# Patient Record
Sex: Male | Born: 1965 | Race: Black or African American | Hispanic: No | Marital: Married | Smoking: Light tobacco smoker
Health system: Southern US, Community
[De-identification: ages and names within clinical notes are randomized; demographics above are authoritative.]

## PROBLEM LIST (undated history)

## (undated) DIAGNOSIS — E291 Testicular hypofunction: Secondary | ICD-10-CM

## (undated) DIAGNOSIS — G43909 Migraine, unspecified, not intractable, without status migrainosus: Secondary | ICD-10-CM

## (undated) DIAGNOSIS — I10 Essential (primary) hypertension: Secondary | ICD-10-CM

## (undated) DIAGNOSIS — G4730 Sleep apnea, unspecified: Secondary | ICD-10-CM

## (undated) DIAGNOSIS — G4733 Obstructive sleep apnea (adult) (pediatric): Secondary | ICD-10-CM

## (undated) HISTORY — DX: Sleep apnea, unspecified: G47.30

## (undated) HISTORY — PX: KNEE SURGERY: SHX244

## (undated) HISTORY — DX: Migraine, unspecified, not intractable, without status migrainosus: G43.909

## (undated) HISTORY — DX: Testicular hypofunction: E29.1

## (undated) HISTORY — DX: Obstructive sleep apnea (adult) (pediatric): G47.33

## (undated) HISTORY — PX: NO PAST SURGERIES: SHX2092

---

## 1997-08-18 ENCOUNTER — Encounter: Admission: RE | Admit: 1997-08-18 | Discharge: 1997-08-18 | Payer: Self-pay | Admitting: Sports Medicine

## 1998-01-04 ENCOUNTER — Encounter: Payer: Self-pay | Admitting: Emergency Medicine

## 1998-01-04 ENCOUNTER — Emergency Department (HOSPITAL_COMMUNITY): Admission: EM | Admit: 1998-01-04 | Discharge: 1998-01-04 | Payer: Self-pay | Admitting: Emergency Medicine

## 1999-01-02 ENCOUNTER — Encounter: Payer: Self-pay | Admitting: Emergency Medicine

## 1999-01-02 ENCOUNTER — Emergency Department (HOSPITAL_COMMUNITY): Admission: EM | Admit: 1999-01-02 | Discharge: 1999-01-02 | Payer: Self-pay | Admitting: *Deleted

## 1999-01-05 ENCOUNTER — Encounter: Payer: Self-pay | Admitting: Emergency Medicine

## 1999-01-05 ENCOUNTER — Emergency Department (HOSPITAL_COMMUNITY): Admission: EM | Admit: 1999-01-05 | Discharge: 1999-01-05 | Payer: Self-pay | Admitting: Emergency Medicine

## 2000-05-12 ENCOUNTER — Emergency Department (HOSPITAL_COMMUNITY): Admission: EM | Admit: 2000-05-12 | Discharge: 2000-05-12 | Payer: Self-pay | Admitting: Emergency Medicine

## 2001-11-21 ENCOUNTER — Emergency Department (HOSPITAL_COMMUNITY): Admission: EM | Admit: 2001-11-21 | Discharge: 2001-11-21 | Payer: Self-pay

## 2001-11-22 ENCOUNTER — Emergency Department (HOSPITAL_COMMUNITY): Admission: EM | Admit: 2001-11-22 | Discharge: 2001-11-22 | Payer: Self-pay | Admitting: Emergency Medicine

## 2002-07-24 ENCOUNTER — Emergency Department (HOSPITAL_COMMUNITY): Admission: EM | Admit: 2002-07-24 | Discharge: 2002-07-24 | Payer: Self-pay | Admitting: Emergency Medicine

## 2002-07-24 ENCOUNTER — Encounter: Payer: Self-pay | Admitting: Emergency Medicine

## 2004-11-29 ENCOUNTER — Emergency Department (HOSPITAL_COMMUNITY): Admission: EM | Admit: 2004-11-29 | Discharge: 2004-11-29 | Payer: Self-pay | Admitting: Emergency Medicine

## 2005-06-02 ENCOUNTER — Emergency Department (HOSPITAL_COMMUNITY): Admission: EM | Admit: 2005-06-02 | Discharge: 2005-06-02 | Payer: Self-pay | Admitting: Family Medicine

## 2007-11-11 ENCOUNTER — Emergency Department (HOSPITAL_COMMUNITY): Admission: EM | Admit: 2007-11-11 | Discharge: 2007-11-12 | Payer: Self-pay | Admitting: Emergency Medicine

## 2008-04-17 ENCOUNTER — Encounter: Payer: Self-pay | Admitting: Internal Medicine

## 2008-04-17 ENCOUNTER — Ambulatory Visit (HOSPITAL_BASED_OUTPATIENT_CLINIC_OR_DEPARTMENT_OTHER): Admission: RE | Admit: 2008-04-17 | Discharge: 2008-04-17 | Payer: Self-pay | Admitting: Internal Medicine

## 2008-04-23 ENCOUNTER — Ambulatory Visit: Payer: Self-pay | Admitting: Internal Medicine

## 2008-05-12 ENCOUNTER — Ambulatory Visit: Payer: Self-pay | Admitting: Internal Medicine

## 2008-05-12 DIAGNOSIS — G4733 Obstructive sleep apnea (adult) (pediatric): Secondary | ICD-10-CM | POA: Insufficient documentation

## 2009-06-07 ENCOUNTER — Telehealth (INDEPENDENT_AMBULATORY_CARE_PROVIDER_SITE_OTHER): Payer: Self-pay | Admitting: *Deleted

## 2009-07-20 ENCOUNTER — Telehealth: Payer: Self-pay | Admitting: Internal Medicine

## 2010-02-28 NOTE — Progress Notes (Signed)
Summary: nos appt  Phone Note Call from Patient   Caller: [email or web address]  Call For: young Summary of Call: LMTCB x2 to rsc nos from 6/23. Initial call taken by: Darletta Moll,  July 20, 2009 2:29 PM

## 2010-02-28 NOTE — Progress Notes (Signed)
Summary: cpap/ appt  Phone Note Call from Patient   Caller: Patient Call For: YOUNG Summary of Call: pt has appt sched'd for 5/25- f/u sleep. pt wants to know if he needs to bring his cpap or the chip in with him. call cell [phone] and leave msg as pt is at work and won't be able to answer.  Initial call taken by: Tivis Ringer, CNA,  Jun 07, 2009 11:46 AM  Follow-up for Phone Call        Please advise. Abigail Miyamoto RN  Jun 07, 2009 11:55 AM   Additional Follow-up for Phone Call Additional follow up Details #1::        Please have him get the chip to his homecare company. He doesn't need to bring the [facility] unless he wants to show me something. Additional Follow-up by: Waymon Budge MD,  Jun 07, 2009 12:19 PM    Additional Follow-up for Phone Call Additional follow up Details #2::    LM for pt to take chip form CPAP to homecare company for download before ov with Dr Maple Hudson. Abigail Miyamoto RN  Jun 07, 2009 12:35 PM

## 2010-06-14 NOTE — Procedures (Signed)
NAME:  Jesus Bradford, Jesus Bradford NO.:  [id number]   MEDICAL RECORD NO.:  [id number]          PATIENT TYPE:  OUT   LOCATION:  SLEEP CENTER                 FACILITY:  [facility]   PHYSICIAN:  Clinton D. Maple Hudson, MD, FCCP, FACPDATE OF BIRTH:  1967/10/10   DATE OF STUDY:                            NOCTURNAL POLYSOMNOGRAM   REFERRING PHYSICIAN:  Eric L. August Saucer, M.D.   INDICATION FOR STUDY:  Hypersomnia with sleep apnea.   EPWORTH SLEEPINESS SCORE:  Epworth sleepiness score 6/24.  BMI 40.7.  Weight 300 pounds.  Height 72 inches.  Neck 17.5 inches.   HOME MEDICATIONS:  None listed.   SLEEP ARCHITECTURE:  Split study protocol.  During the diagnostic phase,  total sleep time was 153.5 minutes with sleep efficiency 80.8%.  Stage I  was 18.2%.  Stage II was 73.3%.  Stage III was absent.  REM 8.5% of  total sleep time.  Sleep latency 12.5 minutes.  REM latency 49.5  minutes.  Awake after sleep onset 25 minutes.  Arousal index markedly  increased at 102.4 indicating increased EEG arousals.   RESPIRATORY DATA:  Split study protocol.  Apnea-hypopnea index (AHI)  113.4 per hour.  A total of 290 events was scored including 278  obstructive apneas and 12 hypopneas.  Events were not positional.  REM  AHI 60 per hour.  CPAP was then titrated to 13 CWP, AHI 0 per hour.  He  wore a medium ResMed Quattro full-face mask with heated humidifier.  Technician commented on the patient's nasal congestion attributed by the  patient to a recent cold.   OXYGEN DATA:  Moderate snoring with oxygen desaturation to a nadir of  68% before CPAP.  After CPAP control, mean oxygen saturation held 94.5%  on room air.   CARDIAC DATA:  Sinus rhythm with occasional PAC.   MOVEMENT/PARASOMNIA:  No significant movement disturbance.  Bathroom x1.  Bruxism was noted.   IMPRESSION/RECOMMENDATIONS:  1. Severe obstructive sleep apnea/hypopnea syndrome, apnea-hypopnea      index 113.4 per hour with non-positional events,  moderate snoring,      and oxygen desaturation to a nadir of 68%.  2. Successful continuous positive airway pressure titration to 13      centimeters of water pressure, apnea-hypopnea index 0 per hour.  He      wore a medium ResMed Quattro full-face mask with heated humidifier.  3. Bruxism.  4. The patient attributed above- normal nasal congestion for himself      to a recent head cold.      Clinton D. Maple Hudson, MD, [facility], FACP  Diplomate, Biomedical engineer of Sleep Medicine  Electronically Signed     CDY/MEDQ  D:  04/23/2008 16:24:25  T:  04/24/2008 01:41:59  Job:  [id number]

## 2010-10-28 LAB — DIFFERENTIAL
Eosinophils Absolute: 0.4
Eosinophils Relative: 5
Monocytes Absolute: 0.8
Neutro Abs: 6.7
Neutrophils Relative %: 70

## 2010-10-28 LAB — POCT CARDIAC MARKERS: CKMB, poc: 1.6

## 2010-10-28 LAB — CBC
HCT: 42
Hemoglobin: 14
MCHC: 33.3
MCV: 93.3
Platelets: 302
RDW: 13

## 2011-02-14 ENCOUNTER — Telehealth: Payer: Self-pay | Admitting: Internal Medicine

## 2011-02-14 NOTE — Telephone Encounter (Signed)
I spoke with pt and is coming in 03/03/11 at 2:00 for OV since it has been since 2010 pt was last seen. Pt was fine with this and nothing further was needed

## 2011-02-28 ENCOUNTER — Encounter: Payer: Self-pay | Admitting: Internal Medicine

## 2011-03-03 ENCOUNTER — Ambulatory Visit: Payer: Self-pay | Admitting: Internal Medicine

## 2011-03-10 ENCOUNTER — Ambulatory Visit: Payer: Self-pay | Admitting: Internal Medicine

## 2011-06-13 ENCOUNTER — Ambulatory Visit (INDEPENDENT_AMBULATORY_CARE_PROVIDER_SITE_OTHER): Payer: BC Managed Care – PPO | Admitting: Internal Medicine

## 2011-06-13 ENCOUNTER — Encounter: Payer: Self-pay | Admitting: Internal Medicine

## 2011-06-13 VITALS — BP 140/92 | HR 71 | Ht 72.0 in | Wt 319.8 lb

## 2011-06-13 DIAGNOSIS — G4733 Obstructive sleep apnea (adult) (pediatric): Secondary | ICD-10-CM

## 2011-06-13 DIAGNOSIS — Z9989 Dependence on other enabling machines and devices: Secondary | ICD-10-CM

## 2011-06-13 NOTE — Progress Notes (Signed)
06/13/11- 24 yoM former smoker followed for obstructive sleep apnea  PCP Dr Willey Blade LOV 05/12/08 NPSG 04/23/08 AHI 113.4/ hr; CPAP 13/ AHI 0/hr, weight 300 lbs Pt states wears mask for 7 hours every night.pt having mask issues  , pressure seems to be ok.  He continues CPAP at 13/Advanced, worn all night every night. He denies snoring and feels rested. Bedtime 11 PM, sleep latency 30 minutes, waking once before up at 7 AM. He has gained 19 pounds since his original sleep study. Has not had any ENT surgery. There is no family history of sleep apnea.Marland Kitchen  ROS-see HPI Constitutional:   No-   weight loss, night sweats, fevers, chills, fatigue, lassitude. HEENT:   No-  headaches, difficulty swallowing, tooth/dental problems, sore throat,       No-  sneezing, itching, ear ache, nasal congestion, post nasal drip,  CV:  No-   chest pain, orthopnea, PND, swelling in lower extremities, anasarca, dizziness, palpitations Resp: No-   shortness of breath with exertion or at rest.              No-   productive cough,  No non-productive cough,  No- coughing up of blood.              No-   change in color of mucus.  No- wheezing.   Skin: No-   rash or lesions. GI:  No-   heartburn, indigestion, abdominal pain, nausea, vomiting,  GU: . MS:  No-   joint pain or swelling. . Neuro-     nothing unusual Psych:  No- change in mood or affect. No depression or anxiety.  No memory loss.  OBJ- Physical Exam General- Alert, Oriented, Affect-appropriate, Distress- none acute, obese Skin- rash-none, lesions- none, excoriation- none Lymphadenopathy- none Head- atraumatic            Eyes- Gross vision intact, PERRLA, conjunctivae and secretions clear            Ears- Hearing, canals-normal            Nose- Clear, no-Septal dev, mucus, polyps, erosion, perforation             Throat- Mallampati IV , mucosa clear , drainage- none, tonsils present Neck- thick neck , trachea midline, no stridor , thyroid nl, carotid no  bruit Chest - symmetrical excursion , unlabored           Heart/CV- RRR , no murmur , no gallop  , no rub, nl s1 s2                           - JVD- none , edema- none, stasis changes- none, varices- none           Lung- clear to P&A, wheeze- none, cough- none , dullness-none, rub- none           Chest wall-  Abd-  Br/ Gen/ Rectal- Not done, not indicated Extrem- cyanosis- none, clubbing, none, atrophy- none, strength- nl Neuro- grossly intact to observation

## 2011-06-13 NOTE — Patient Instructions (Signed)
Order- DME Advanced- replacement CPAP mask of choice and supplies     Dx OSA

## 2011-06-20 NOTE — Assessment & Plan Note (Signed)
Importance of weight loss for general health and specifically for his sleep apnea was discussed

## 2011-06-20 NOTE — Assessment & Plan Note (Signed)
He is describing good compliance and control with long-term use of CPAP 13/Advanced using a nasal mask. He needs help replacing an old mask. We reviewed the medical importance of sleep apnea and available treatment options. Weight loss and driving responsibility were emphasized.

## 2012-03-04 ENCOUNTER — Encounter: Payer: Self-pay | Admitting: Hematology

## 2012-06-02 ENCOUNTER — Telehealth: Payer: Self-pay | Admitting: Internal Medicine

## 2012-06-02 NOTE — Telephone Encounter (Signed)
Called pt x's 3 to make next ov per recall.  Pt never returned calls.  Mailed recall letter 06/02/12. Emily E McAlister °

## 2012-06-03 ENCOUNTER — Encounter: Payer: Self-pay | Admitting: Internal Medicine

## 2012-11-23 ENCOUNTER — Emergency Department (HOSPITAL_COMMUNITY): Admission: EM | Admit: 2012-11-23 | Discharge: 2012-11-23 | Discharge status: Against Medical Advice | Payer: Self-pay

## 2012-11-23 NOTE — ED Notes (Signed)
Pt reports he has dental pain to his entire mouth, seen here about a month ago for the same.  [facility] to [facility] and was told he needs insurance to have them removed.

## 2014-08-31 ENCOUNTER — Encounter (HOSPITAL_COMMUNITY): Payer: Self-pay

## 2014-08-31 ENCOUNTER — Emergency Department (HOSPITAL_COMMUNITY): Payer: 59

## 2014-08-31 ENCOUNTER — Emergency Department (HOSPITAL_COMMUNITY)
Admission: EM | Admit: 2014-08-31 | Discharge: 2014-08-31 | Disposition: A | Discharge status: Home / Self Care | Payer: 59 | Attending: Emergency Medicine | Admitting: Emergency Medicine

## 2014-08-31 DIAGNOSIS — R079 Chest pain, unspecified: Secondary | ICD-10-CM

## 2014-08-31 DIAGNOSIS — Z87828 Personal history of other (healed) physical injury and trauma: Secondary | ICD-10-CM | POA: Diagnosis not present

## 2014-08-31 DIAGNOSIS — Z8679 Personal history of other diseases of the circulatory system: Secondary | ICD-10-CM | POA: Diagnosis not present

## 2014-08-31 DIAGNOSIS — Z79899 Other long term (current) drug therapy: Secondary | ICD-10-CM | POA: Diagnosis not present

## 2014-08-31 DIAGNOSIS — R200 Anesthesia of skin: Secondary | ICD-10-CM | POA: Diagnosis not present

## 2014-08-31 DIAGNOSIS — R072 Precordial pain: Secondary | ICD-10-CM | POA: Diagnosis present

## 2014-08-31 DIAGNOSIS — Z8669 Personal history of other diseases of the nervous system and sense organs: Secondary | ICD-10-CM | POA: Diagnosis not present

## 2014-08-31 DIAGNOSIS — Z87891 Personal history of nicotine dependence: Secondary | ICD-10-CM | POA: Diagnosis not present

## 2014-08-31 LAB — I-STAT TROPONIN, ED: TROPONIN I, POC: 0 ng/mL (ref 0.00–0.08)

## 2014-08-31 LAB — CBC
HCT: 41.5 % (ref 39.0–52.0)
Hemoglobin: 13.9 g/dL (ref 13.0–17.0)
MCH: 31.7 pg (ref 26.0–34.0)
MCHC: 33.5 g/dL (ref 30.0–36.0)
MCV: 94.7 fL (ref 78.0–100.0)
PLATELETS: 295 10*3/uL (ref 150–400)
RBC: 4.38 MIL/uL (ref 4.22–5.81)
RDW: 12.3 % (ref 11.5–15.5)
WBC: 5.7 10*3/uL (ref 4.0–10.5)

## 2014-08-31 LAB — BASIC METABOLIC PANEL
Anion gap: 5 (ref 5–15)
BUN: 11 mg/dL (ref 6–20)
CHLORIDE: 109 mmol/L (ref 101–111)
CO2: 26 mmol/L (ref 22–32)
Calcium: 9 mg/dL (ref 8.9–10.3)
Creatinine, Ser: 0.79 mg/dL (ref 0.61–1.24)
GFR calc Af Amer: >60 mL/min (ref >60)
GLUCOSE: 92 mg/dL (ref 65–99)
Potassium: 4.1 mmol/L (ref 3.5–5.1)
SODIUM: 140 mmol/L (ref 135–145)

## 2014-08-31 MED ORDER — ASPIRIN 81 MG PO CHEW
324.0000 mg | CHEWABLE_TABLET | Freq: Once | ORAL | Status: AC
  Administered 2014-08-31: 324 mg via ORAL
  Filled 2014-08-31: qty 4

## 2014-08-31 NOTE — Discharge Instructions (Signed)

## 2014-08-31 NOTE — ED Provider Notes (Signed)
CSN: [id number]     Arrival date & time 08/31/14  1008 History   First MD Initiated Contact with Patient 08/31/14 1057     Chief Complaint  Patient presents with  . Chest Pain  . Numbness     (Consider location/radiation/quality/duration/timing/severity/associated sxs/prior Treatment) Patient is a 49 y.o. male presenting with chest pain. The history is provided by the patient.  Chest Pain Pain location:  Substernal area Pain quality: pressure   Pain radiates to:  Epigastrium Pain radiates to the back: no   Pain severity:  Moderate Onset quality:  Sudden Duration:  1 day Timing:  Constant Progression:  Resolved Chronicity:  Recurrent Context: stress   Relieved by:  Rest Worsened by:  Nothing tried Ineffective treatments:  None tried Associated symptoms: numbness (left arm)   Associated symptoms: no abdominal pain, no anxiety, no back pain, no fever, no headache, no nausea, no near-syncope, no orthopnea, no palpitations, no shortness of breath and not vomiting   Risk factors: male sex   Risk factors: no coronary artery disease, no diabetes mellitus, no high cholesterol, no hypertension and no smoking    49 yo M with a chief complaint of chest pain. This started yesterday lasted for about 24 hours has not occurred today. Patient states that the pain started retrosternally radiated up into his neck and was associated with some paresthesias to his left arm. Patient stated this started when he was driving with his family up to [place]. Patient states that they made multiple stops and he got out of moves around during this trip. Patient denies any leg swelling denies any history of blood clots denies cough fever or chills. Paresthesias to the entire left arm. Noted nerve distribution. Patient denies any nausea shortness of breath diaphoresis with chest pain. Patient has pain like this previously sometimes related to eating. He also states that when he goes to the gym and works out it  re-creates this pain. Usually improves with rest.  Past Medical History  Diagnosis Date  . OSA (obstructive sleep apnea)   . Hypogonadism male   . Migraines   . Concussion     football  . Sleep apnea    Past Surgical History  Procedure Laterality Date  . No past surgeries     Family History  Problem Relation Age of Onset  . Diabetes Father   . Allergy (severe) Father   . Migraines Mother   . Asthma Mother    History  Substance Use Topics  . Smoking status: Former Smoker -- 1.00 packs/day for 10 years    Types: Cigarettes    Quit date: 01/28/2003  . Smokeless tobacco: Never Used  . Alcohol Use: Yes     Comment: SOCIALLY     Review of Systems  Constitutional: Negative for fever and chills.  HENT: Negative for congestion and facial swelling.   Eyes: Negative for discharge and visual disturbance.  Respiratory: Positive for chest tightness. Negative for shortness of breath.   Cardiovascular: Positive for chest pain. Negative for palpitations, orthopnea and near-syncope.  Gastrointestinal: Negative for nausea, vomiting, abdominal pain and diarrhea.  Musculoskeletal: Negative for myalgias, back pain and arthralgias.  Skin: Negative for color change and rash.  Neurological: Positive for numbness (left arm). Negative for tremors, syncope and headaches.  Psychiatric/Behavioral: Negative for confusion and dysphoric mood.      Allergies  Review of patient's allergies indicates no known allergies.  Home Medications   Prior to Admission medications   Medication Sig  Start Date End Date Taking? Authorizing Provider  eletriptan (RELPAX) 20 MG tablet Take 20 mg by mouth as needed for migraine or headache. May repeat in 2 hours if headache persists or recurs.   Yes Historical Provider, MD  ibuprofen (ADVIL,MOTRIN) 800 MG tablet Take 800 mg by mouth every 8 (eight) hours as needed for headache or moderate pain.   Yes Historical Provider, MD  Multiple Vitamin (ONE-A-DAY MENS PO)  Take 1 tablet by mouth daily.   Yes Historical Provider, MD  Multiple Vitamins-Minerals (ZINC PO) Take 1 tablet by mouth daily.   Yes Historical Provider, MD   BP 162/62 mmHg  Pulse 61  Temp(Src) 98.1 F (36.7 C) (Oral)  Resp 18  SpO2 97% Physical Exam  Constitutional: He is oriented to person, place, and time. He appears well-developed and well-nourished.  HENT:  Head: Normocephalic and atraumatic.  Eyes: EOM are normal. Pupils are equal, round, and reactive to light.  Neck: Normal range of motion. Neck supple. No JVD present.  Cardiovascular: Normal rate and regular rhythm.  Exam reveals no gallop and no friction rub.   No murmur heard. Pulmonary/Chest: No respiratory distress. He has no wheezes. He exhibits no tenderness.  Abdominal: He exhibits no distension. There is no tenderness. There is no rebound and no guarding.  Musculoskeletal: Normal range of motion. He exhibits no edema or tenderness.  Full range of motion of neck without recreation of symptoms. No noted trapezius tenderness. No pain with range of motion of the shoulder. No increased pain with Tinel's sign. Pulse motor and sensation intact distally.  Neurological: He is alert and oriented to person, place, and time.  Skin: No rash noted. No pallor.  Psychiatric: He has a normal mood and affect. His behavior is normal.    ED Course  Procedures (including critical care time) Labs Review Labs Reviewed  BASIC METABOLIC PANEL  CBC  I-STAT TROPOININ, ED    Imaging Review No results found.   EKG Interpretation   Date/Time:  Thursday August 31 2014 10:18:20 EDT Ventricular Rate:  54 PR Interval:  151 QRS Duration: 91 QT Interval:  424 QTC Calculation: 402 R Axis:   15 Text Interpretation:  Sinus rhythm Borderline T abnormalities, inferior  leads Baseline wander in lead(s) V2 No significant change was found  Confirmed by Shernell Saldierna MD, DANIEL ([id number]) on 08/31/2014 10:20:38 AM      MDM   Final diagnoses:  Chest  pain, unspecified chest pain type    49 yo M with a chief complaint of chest pain. PERC negative, very low risk for cardiac disease. Patient has some typical symptoms. Will obtain one troponin with no chest pain over the past 12 hours. EKG unchanged from prior.  Troponin negative. Lab work otherwise unremarkable. Chest x-ray reviewed by me negative.  3:39 PM:  I have discussed the diagnosis/risks/treatment options with the patient and family and believe the pt to be eligible for discharge home to follow-up with PCP for possible stress. We also discussed returning to the ED immediately if new or worsening sx occur. We discussed the sx which are most concerning (e.g., sudden worsening chest pain) that necessitate immediate return. Medications administered to the patient during their visit and any new prescriptions provided to the patient are listed below.  Medications given during this visit Medications  aspirin chewable tablet 324 mg (324 mg Oral Given 08/31/14 1141)    Discharge Medication List as of 08/31/2014  1:47 PM       The patient  appears reasonably screen and/or stabilized for discharge and I doubt any other medical condition or other [facility] requiring further screening, evaluation, or treatment in the ED at this time prior to discharge.    Melene Plan, DO 08/31/14 1539

## 2014-08-31 NOTE — ED Notes (Signed)
Pt c/o central and L sided chest pain radiating into bilateral shoulders, neck, and arms x 2 episodes x 1 week.  Denies pain.  Pt reports intermittent chest pain w/ working out x "forever."  Sts when the pain radiates down his arms, it turns into numbness.  Pt reports he has "just been sitting" when these episodes have occured.

## 2014-09-13 ENCOUNTER — Ambulatory Visit
Admission: RE | Admit: 2014-09-13 | Discharge: 2014-09-13 | Disposition: A | Discharge status: Home / Self Care | Payer: 59 | Source: Ambulatory Visit | Attending: Internal Medicine | Admitting: Internal Medicine

## 2014-09-13 ENCOUNTER — Other Ambulatory Visit: Payer: Self-pay | Admitting: Internal Medicine

## 2014-09-13 DIAGNOSIS — M542 Cervicalgia: Secondary | ICD-10-CM

## 2014-09-26 ENCOUNTER — Other Ambulatory Visit: Payer: Self-pay | Admitting: Internal Medicine

## 2014-09-26 DIAGNOSIS — R1310 Dysphagia, unspecified: Secondary | ICD-10-CM

## 2014-10-06 ENCOUNTER — Ambulatory Visit
Admission: RE | Admit: 2014-10-06 | Discharge: 2014-10-06 | Disposition: A | Discharge status: Home / Self Care | Payer: 59 | Source: Ambulatory Visit | Attending: Internal Medicine | Admitting: Internal Medicine

## 2014-10-06 DIAGNOSIS — R1310 Dysphagia, unspecified: Secondary | ICD-10-CM

## 2018-11-25 ENCOUNTER — Emergency Department (HOSPITAL_COMMUNITY): Payer: 59

## 2018-11-25 ENCOUNTER — Other Ambulatory Visit: Payer: Self-pay

## 2018-11-25 ENCOUNTER — Encounter (HOSPITAL_COMMUNITY): Payer: Self-pay | Admitting: Emergency Medicine

## 2018-11-25 ENCOUNTER — Emergency Department (HOSPITAL_COMMUNITY)
Admission: EM | Admit: 2018-11-25 | Discharge: 2018-11-25 | Disposition: A | Discharge status: Home / Self Care | Payer: 59 | Attending: Emergency Medicine | Admitting: Emergency Medicine

## 2018-11-25 DIAGNOSIS — U071 COVID-19: Secondary | ICD-10-CM | POA: Insufficient documentation

## 2018-11-25 DIAGNOSIS — Z79899 Other long term (current) drug therapy: Secondary | ICD-10-CM | POA: Diagnosis not present

## 2018-11-25 DIAGNOSIS — Z87891 Personal history of nicotine dependence: Secondary | ICD-10-CM | POA: Insufficient documentation

## 2018-11-25 DIAGNOSIS — Z20822 Contact with and (suspected) exposure to covid-19: Secondary | ICD-10-CM

## 2018-11-25 DIAGNOSIS — R0602 Shortness of breath: Secondary | ICD-10-CM | POA: Diagnosis present

## 2018-11-25 LAB — COMPREHENSIVE METABOLIC PANEL
ALT: 28 U/L (ref 0–44)
AST: 34 U/L (ref 15–41)
Albumin: 3.6 g/dL (ref 3.5–5.0)
Alkaline Phosphatase: 76 U/L (ref 38–126)
Anion gap: 8 (ref 5–15)
BUN: 13 mg/dL (ref 6–20)
CO2: 24 mmol/L (ref 22–32)
Calcium: 8.8 mg/dL — ABNORMAL LOW (ref 8.9–10.3)
Chloride: 106 mmol/L (ref 98–111)
Creatinine, Ser: 0.81 mg/dL (ref 0.61–1.24)
GFR calc Af Amer: >60 mL/min (ref >60)
GFR calc non Af Amer: >60 mL/min (ref >60)
Glucose, Bld: 122 mg/dL — ABNORMAL HIGH (ref 70–99)
Potassium: 4.1 mmol/L (ref 3.5–5.1)
Sodium: 138 mmol/L (ref 135–145)
Total Bilirubin: 0.6 mg/dL (ref 0.3–1.2)
Total Protein: 7.4 g/dL (ref 6.5–8.1)

## 2018-11-25 LAB — CBC WITH DIFFERENTIAL/PLATELET
Abs Immature Granulocytes: 0.04 10*3/uL (ref 0.00–0.07)
Basophils Absolute: 0 10*3/uL (ref 0.0–0.1)
Basophils Relative: 1 %
Eosinophils Absolute: 0.3 10*3/uL (ref 0.0–0.5)
Eosinophils Relative: 5 %
HCT: 44.6 % (ref 39.0–52.0)
Hemoglobin: 14.2 g/dL (ref 13.0–17.0)
Immature Granulocytes: 1 %
Lymphocytes Relative: 20 %
Lymphs Abs: 1.2 10*3/uL (ref 0.7–4.0)
MCH: 31.1 pg (ref 26.0–34.0)
MCHC: 31.8 g/dL (ref 30.0–36.0)
MCV: 97.6 fL (ref 80.0–100.0)
Monocytes Absolute: 0.7 10*3/uL (ref 0.1–1.0)
Monocytes Relative: 11 %
Neutro Abs: 3.8 10*3/uL (ref 1.7–7.7)
Neutrophils Relative %: 62 %
Platelets: 232 10*3/uL (ref 150–400)
RBC: 4.57 MIL/uL (ref 4.22–5.81)
RDW: 12 % (ref 11.5–15.5)
WBC: 6.1 10*3/uL (ref 4.0–10.5)
nRBC: 0 % (ref 0.0–0.2)

## 2018-11-25 LAB — CBG MONITORING, ED: Glucose-Capillary: 123 mg/dL — ABNORMAL HIGH (ref 70–99)

## 2018-11-25 NOTE — ED Notes (Signed)
Patient verbalizes understanding of discharge instructions. Opportunity for questioning and answers were provided. Armband removed by staff, pt discharged from ED.  

## 2018-11-25 NOTE — ED Notes (Signed)
Pt reports feeling clammy "like his sugar is bottoming out," reports not diabetic. CBG checked 123. States "feels like I'm gonna pass out." VSS.

## 2018-11-25 NOTE — ED Notes (Signed)
Pt ambulated with pulse oximetry, no signs of shortness of breath, or hypoxia (sat stayed at 95-100% the whole time).

## 2018-11-25 NOTE — ED Triage Notes (Signed)
Pt to ER for evaluation of shortness of breath associated with COVID 19, reports tested positive on Monday at [facility]. Reports "I don't feel bad," but reports feeling congested. +Cough, and pain with coughing. He is no apparent distress. VSS, no fever. Hx of hypertension on medications.

## 2018-11-25 NOTE — ED Provider Notes (Signed)
[facility]ERGENCY DEPARTMENT Provider Note   CSN: [id number] Arrival date & time: 11/25/18  0706     History   Chief Complaint Chief Complaint  Patient presents with  . Shortness of Breath    COVID+    HPI Keni Maroney is a 53 y.o. male.      Shortness of Breath Severity:  Moderate Onset quality:  Sudden Duration:  5 minutes Timing:  Constant Progression:  Resolved Chronicity:  New Context comment:  Diagnosed with COVID 19 yesterday. Was raking leaves today when DOE occured. Relieved by:  Rest Worsened by:  Activity Ineffective treatments:  None tried Associated symptoms: no abdominal pain, no chest pain, no cough, no ear pain, no fever, no rash, no sore throat and no vomiting   Risk factors: obesity   Risk factors: no family hx of DVT, no hx of PE/DVT, no prolonged immobilization and no recent surgery     Past Medical History:  Diagnosis Date  . Concussion    football  . Hypogonadism male   . Migraines   . OSA (obstructive sleep apnea)   . Sleep apnea     Patient Active Problem List   Diagnosis Date Noted  . Morbid obesity ([place]) 06/20/2011  . OBSTRUCTIVE SLEEP APNEA 05/12/2008    Past Surgical History:  Procedure Laterality Date  . NO PAST SURGERIES          Home Medications    Prior to Admission medications   Medication Sig Start Date End Date Taking? Authorizing Provider  eletriptan (RELPAX) 20 MG tablet Take 20 mg by mouth as needed for migraine or headache. May repeat in 2 hours if headache persists or recurs.    [provider]  ibuprofen (ADVIL,MOTRIN) 800 MG tablet Take 800 mg by mouth every 8 (eight) hours as needed for headache or moderate pain.    [provider]  Multiple Vitamin (ONE-A-DAY MENS PO) Take 1 tablet by mouth daily.    [provider]  Multiple Vitamins-Minerals (ZINC PO) Take 1 tablet by mouth daily.    [provider]    Family History Family History  Problem  Relation Age of Onset  . Diabetes Father   . Allergy (severe) Father   . Migraines Mother   . Asthma Mother     Social History Social History   Tobacco Use  . Smoking status: Former Smoker    Packs/day: 1.00    Years: 10.00    Pack years: 10.00    Types: Cigarettes    Quit date: 01/28/2003    Years since quitting: 15.8  . Smokeless tobacco: Never Used  Substance Use Topics  . Alcohol use: Yes    Comment: SOCIALLY   . Drug use: No     Allergies   Patient has no known allergies.   Review of Systems Review of Systems  Constitutional: Negative for chills and fever.  HENT: Negative for ear pain and sore throat.   Eyes: Negative for pain and visual disturbance.  Respiratory: Negative for cough and shortness of breath.   Cardiovascular: Negative for chest pain and palpitations.  Gastrointestinal: Negative for abdominal pain and vomiting.  Genitourinary: Negative for dysuria and hematuria.  Musculoskeletal: Negative for arthralgias and back pain.  Skin: Negative for color change and rash.  Neurological: Negative for seizures and syncope.  All other systems reviewed and are negative.    Physical Exam Updated Vital Signs BP 134/90 (BP Location: Right Arm)   Pulse 78  Temp 98.2 F (36.8 C) (Oral)   Resp 18   SpO2 98%   Physical Exam Vitals signs and nursing note reviewed.  Constitutional:      Appearance: He is well-developed. He is not ill-appearing or diaphoretic.  HENT:     Head: Normocephalic and atraumatic.  Eyes:     Conjunctiva/sclera: Conjunctivae normal.  Neck:     Musculoskeletal: Neck supple.  Cardiovascular:     Rate and Rhythm: Normal rate and regular rhythm.     Heart sounds: No murmur.  Pulmonary:     Effort: Pulmonary effort is normal. No tachypnea, accessory muscle usage or respiratory distress.     Breath sounds: Normal breath sounds. No stridor. No decreased breath sounds or wheezing.  Abdominal:     Palpations: Abdomen is soft.      Tenderness: There is no abdominal tenderness.  Musculoskeletal: Normal range of motion.     Right lower leg: No edema.     Left lower leg: No edema.  Skin:    General: Skin is warm and dry.     Capillary Refill: Capillary refill takes less than 2 seconds.  Neurological:     General: No focal deficit present.     Mental Status: He is alert and oriented to person, place, and time.  Psychiatric:        Mood and Affect: Mood normal.      ED Treatments / Results  Labs (all labs ordered are listed, but only abnormal results are displayed) Labs Reviewed  COMPREHENSIVE METABOLIC PANEL - Abnormal; Notable for the following components:      Result Value   Glucose, Bld 122 (*)    Calcium 8.8 (*)    All other components within normal limits  CBG MONITORING, ED - Abnormal; Notable for the following components:   Glucose-Capillary 123 (*)    All other components within normal limits  CBC WITH DIFFERENTIAL/PLATELET    EKG None  Radiology No results found.  Procedures Procedures (including critical care time)  Medications Ordered in ED Medications - No data to display   Initial Impression / Assessment and Plan / ED Course  I have reviewed the triage vital signs and the nursing notes.  Pertinent labs & imaging results that were available during my care of the patient were reviewed by me and considered in my medical decision making (see chart for details).        Patient is a 53 year old male with history and physical exam as above presents emergency department for evaluation of shortness of breath this morning while raking leaves.  Symptoms have entirely resolved at this time.  He states he tested positive for COVID-19 yesterday but has not had any symptoms until today.  Denies cough or fever but states that he has had recent contact test positive for COVID-19.  Patient is resting in bed in no acute distress at this time.  Chest x-ray as well as CBC and metabolic panel were  obtained.  Chest x-ray demonstrated some findings consistent with scarring however no findings concerning for bacterial pneumonia at this time.  Doubt PE based on history and physical exam.  Doubt ACS based on history and physical exam and EKG demonstrates no emergent findings concerning for ischemia or dysrhythmia.  Patient was given my usual and customary discussion regarding COVID-19 as well as shortness of breath including anticipatory guidance and strict return precautions.  He verbalized understanding of plan.  Patient was ambulated in the emergency department with pulse ox  prior to discharge and did not desaturate.  Patient was seen in conjunction with my attending physician Dr. Jeraldine LootsLockwood.  Final Clinical Impressions(s) / ED Diagnoses   Final diagnoses:  COVID-19    ED Discharge Orders    None       Jonna Clarkyder, Rinda Rollyson, MD 11/25/18 1749    Gerhard MunchLockwood, Robert, MD 11/26/18 [phone]

## 2018-11-27 ENCOUNTER — Other Ambulatory Visit: Payer: Self-pay

## 2018-11-27 ENCOUNTER — Emergency Department (HOSPITAL_COMMUNITY): Payer: 59

## 2018-11-27 ENCOUNTER — Encounter (HOSPITAL_COMMUNITY): Payer: Self-pay

## 2018-11-27 ENCOUNTER — Emergency Department (HOSPITAL_COMMUNITY)
Admission: EM | Admit: 2018-11-27 | Discharge: 2018-11-27 | Disposition: A | Discharge status: Home / Self Care | Payer: 59 | Attending: Emergency Medicine | Admitting: Emergency Medicine

## 2018-11-27 DIAGNOSIS — R0600 Dyspnea, unspecified: Secondary | ICD-10-CM | POA: Diagnosis present

## 2018-11-27 DIAGNOSIS — U071 COVID-19: Secondary | ICD-10-CM | POA: Insufficient documentation

## 2018-11-27 DIAGNOSIS — Z87891 Personal history of nicotine dependence: Secondary | ICD-10-CM | POA: Diagnosis not present

## 2018-11-27 DIAGNOSIS — Z79899 Other long term (current) drug therapy: Secondary | ICD-10-CM | POA: Diagnosis not present

## 2018-11-27 LAB — CBC
HCT: 43.3 % (ref 39.0–52.0)
Hemoglobin: 14 g/dL (ref 13.0–17.0)
MCH: 31.5 pg (ref 26.0–34.0)
MCHC: 32.3 g/dL (ref 30.0–36.0)
MCV: 97.5 fL (ref 80.0–100.0)
Platelets: 232 10*3/uL (ref 150–400)
RBC: 4.44 MIL/uL (ref 4.22–5.81)
RDW: 11.9 % (ref 11.5–15.5)
WBC: 5.6 10*3/uL (ref 4.0–10.5)
nRBC: 0 % (ref 0.0–0.2)

## 2018-11-27 LAB — BASIC METABOLIC PANEL
Anion gap: 7 (ref 5–15)
BUN: 8 mg/dL (ref 6–20)
CO2: 24 mmol/L (ref 22–32)
Calcium: 9 mg/dL (ref 8.9–10.3)
Chloride: 107 mmol/L (ref 98–111)
Creatinine, Ser: 0.73 mg/dL (ref 0.61–1.24)
GFR calc Af Amer: >60 mL/min (ref >60)
GFR calc non Af Amer: >60 mL/min (ref >60)
Glucose, Bld: 99 mg/dL (ref 70–99)
Potassium: 3.9 mmol/L (ref 3.5–5.1)
Sodium: 138 mmol/L (ref 135–145)

## 2018-11-27 MED ORDER — BENZONATATE 100 MG PO CAPS: 100.0000 mg | ORAL_CAPSULE | Freq: Three times a day (TID) | ORAL | 0 refills | Status: DC

## 2018-11-27 MED ORDER — MENTHOL 3 MG MT LOZG
1.0000 | LOZENGE | OROMUCOSAL | Status: DC | PRN
  Filled 2018-11-27: qty 9

## 2018-11-27 NOTE — Discharge Instructions (Addendum)
Take the Tessalon medication to help with cough, try taking over-the-counter Cepacol to help with your sore throat.  Continue to monitor for worsening symptoms, shortness of breath

## 2018-11-27 NOTE — ED Provider Notes (Signed)
[facility]-EMERGENCY DEPT Provider Note   CSN: [id number] Arrival date & time: 11/27/18  1212     History   Chief Complaint Chief Complaint  Patient presents with  . Dysphagia    HPI Jesus Bradford is a 53 y.o. male.     HPI Patient was recently diagnosed with COVID-19.  He received results 2 days ago.  Patient started having symptoms yesterday.  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  Patient went to the emergency room at [facility].  Patient had chest x-ray and laboratory test.  He was discharged home.  Patient states since yesterday he continues to feel like it is hard for him to breathe.  He also has pain with swallowing and feels like his throat may be closing up.  He denies any leg swelling.  No difficulty speaking.  Patient came back to the ED today because of his worsening symptoms. Past Medical History:  Diagnosis Date  . Concussion    football  . Hypogonadism male   . Migraines   . OSA (obstructive sleep apnea)   . Sleep apnea     Patient Active Problem List   Diagnosis Date Noted  . Morbid obesity (HCC) 06/20/2011  . OBSTRUCTIVE SLEEP APNEA 05/12/2008    Past Surgical History:  Procedure Laterality Date  . NO PAST SURGERIES          Home Medications    Prior to Admission medications   Medication Sig Start Date End Date Taking? Authorizing Provider  diltiazem (DILACOR XR) 180 MG 24 hr capsule Take 180 mg by mouth daily.   Yes [provider]  eletriptan (RELPAX) 20 MG tablet Take 20 mg by mouth as needed for migraine or headache. May repeat in 2 hours if headache persists or recurs.   Yes [provider]  rosuvastatin (CRESTOR) 20 MG tablet Take 20 mg by mouth once a week. 11/26/18  Yes [provider]  benzonatate (TESSALON) 100 MG capsule Take 1 capsule (100 mg total) by mouth every 8 (eight) hours. 11/27/18   Jesus Dibbles, MD    Family History Family History  Problem  Relation Age of Onset  . Diabetes Father   . Allergy (severe) Father   . Migraines Mother   . Asthma Mother     Social History Social History   Tobacco Use  . Smoking status: Former Smoker    Packs/day: 1.00    Years: 10.00    Pack years: 10.00    Types: Cigarettes    Quit date: 01/28/2003    Years since quitting: 15.8  . Smokeless tobacco: Never Used  Substance Use Topics  . Alcohol use: Yes    Comment: SOCIALLY   . Drug use: No     Allergies   Patient has no known allergies.   Review of Systems Review of Systems  All other systems reviewed and are negative.    Physical Exam Updated Vital Signs BP 140/82   Pulse 63   Temp 99 F (37.2 C) (Oral)   SpO2 98%   Physical Exam Vitals signs and nursing note reviewed.  Constitutional:      General: He is not in acute distress.    Appearance: He is well-developed.  HENT:     Head: Normocephalic and atraumatic.     Right Ear: External ear normal.     Left Ear: External ear normal.     Nose: No rhinorrhea.  Mouth/Throat:     Mouth: Mucous membranes are moist.     Pharynx: Oropharynx is clear. No oropharyngeal exudate or posterior oropharyngeal erythema.     Comments: No edema of the uvula or posterior pharynx Eyes:     General: No scleral icterus.       Right eye: No discharge.        Left eye: No discharge.     Conjunctiva/sclera: Conjunctivae normal.  Neck:     Musculoskeletal: Neck supple.     Trachea: No tracheal deviation.  Cardiovascular:     Rate and Rhythm: Normal rate and regular rhythm.  Pulmonary:     Effort: Pulmonary effort is normal. No respiratory distress.     Breath sounds: Normal breath sounds. No stridor. No wheezing or rales.  Abdominal:     General: Bowel sounds are normal. There is no distension.     Palpations: Abdomen is soft.     Tenderness: There is no abdominal tenderness. There is no guarding or rebound.  Musculoskeletal:        General: No tenderness.  Skin:    General:  Skin is warm and dry.     Findings: No rash.  Neurological:     Mental Status: He is alert.     Cranial Nerves: No cranial nerve deficit (no facial droop, extraocular movements intact, no slurred speech).     Sensory: No sensory deficit.     Motor: No abnormal muscle tone or seizure activity.     Coordination: Coordination normal.      ED Treatments / Results  Labs (all labs ordered are listed, but only abnormal results are displayed) Labs Reviewed  CBC  BASIC METABOLIC PANEL    EKG None  Radiology Dg Chest Portable 1 View  Result Date: 11/27/2018 CLINICAL DATA:  Worsening dyspnea.  COVID-19 virus infection. EXAM: PORTABLE CHEST 1 VIEW COMPARISON:  11/25/2018 FINDINGS: Lordotic positioning noted. Stable heart size. Both lungs are clear. No evidence of pneumothorax or pleural effusion. IMPRESSION: No active disease. Electronically Signed   By: Danae OrleansJohn A Stahl M.D.   On: 11/27/2018 13:53    Procedures Procedures (including critical care time)  Medications Ordered in ED Medications  menthol-cetylpyridinium (CEPACOL) lozenge 3 mg (has no administration in time range)     Initial Impression / Assessment and Plan / ED Course  I have reviewed the triage vital signs and the nursing notes.  Pertinent labs & imaging results that were available during my care of the patient were reviewed by me and considered in my medical decision making (see chart for details).      Patient presents with worsening symptoms associated with known COVID-19 infection.  Patient is complaining of sore throat, as well as difficulty breathing.  Patient was breathing easily in the ED.  No evidence of pneumonia on chest x-ray.  No evidence of pharyngeal edema or difficulty swallowing.  Symptoms are consistent with COVID-19 illness but fortunately no signs of severe complications at this time.  Patient still appears stable for outpatient management.  [name] was evaluated in Emergency Department on  11/27/2018 for the symptoms described in the history of present illness. He was evaluated in the context of the global COVID-19 pandemic, which necessitated consideration that the patient might be at risk for infection with the SARS-CoV-2 virus that causes COVID-19. Institutional protocols and algorithms that pertain to the evaluation of patients at risk for COVID-19 are in a state of rapid change based on information released by regulatory bodies  including the CDC and federal and state organizations. These policies and algorithms were followed during the patient's care in the ED.   Final Clinical Impressions(s) / ED Diagnoses   Final diagnoses:  COVID-19 virus infection    ED Discharge Orders         Ordered    benzonatate (TESSALON) 100 MG capsule  Every 8 hours     11/27/18 1433           Dorie Rank, MD 11/27/18 1609

## 2018-11-27 NOTE — ED Triage Notes (Signed)
Tells me he has recently tested positive for SARS CO-V and his c/o is of "having trouble swallowing; although it's better now". He is in no distress and is breathing normally.

## 2019-05-16 ENCOUNTER — Other Ambulatory Visit: Payer: Self-pay

## 2019-05-16 ENCOUNTER — Emergency Department (HOSPITAL_BASED_OUTPATIENT_CLINIC_OR_DEPARTMENT_OTHER): Payer: No Typology Code available for payment source

## 2019-05-16 ENCOUNTER — Emergency Department (HOSPITAL_BASED_OUTPATIENT_CLINIC_OR_DEPARTMENT_OTHER)
Admission: EM | Admit: 2019-05-16 | Discharge: 2019-05-16 | Disposition: A | Discharge status: Home / Self Care | Payer: No Typology Code available for payment source | Attending: Emergency Medicine | Admitting: Emergency Medicine

## 2019-05-16 DIAGNOSIS — Z87891 Personal history of nicotine dependence: Secondary | ICD-10-CM | POA: Diagnosis not present

## 2019-05-16 DIAGNOSIS — M25562 Pain in left knee: Secondary | ICD-10-CM | POA: Diagnosis present

## 2019-05-16 DIAGNOSIS — Z79899 Other long term (current) drug therapy: Secondary | ICD-10-CM | POA: Diagnosis not present

## 2019-05-16 MED ORDER — HYDROCODONE-ACETAMINOPHEN 5-325 MG PO TABS: 1.0000 | ORAL_TABLET | Freq: Four times a day (QID) | ORAL | 0 refills | Status: DC | PRN

## 2019-05-16 MED ORDER — IBUPROFEN 800 MG PO TABS: 800.0000 mg | ORAL_TABLET | Freq: Three times a day (TID) | ORAL | 0 refills | Status: DC | PRN

## 2019-05-16 MED FILL — IBUPROFEN 800 MG TAB: 800 | 7 days supply | Qty: 21 | Fill #0

## 2019-05-16 MED FILL — HYDROCODON-APAP 5-325: 5-325 | 2 days supply | Qty: 10 | Fill #0

## 2019-05-16 NOTE — ED Notes (Signed)
Pt ambulated to xr on crutches with steady gait

## 2019-05-16 NOTE — ED Notes (Signed)
Ice applied

## 2019-05-16 NOTE — ED Triage Notes (Signed)
Pt states injured his knee while at work, was loading equipment, and twisted, feeling snap in left knee.  Pt arrives on crutches.  Supervisor states need drug screen for visit.

## 2019-05-16 NOTE — ED Notes (Signed)
UDS completed at 0945am not at 1045am. Time was corrected on Pts form and refaxed.

## 2019-05-16 NOTE — ED Provider Notes (Signed)
[facility] EMERGENCY DEPARTMENT Provider Note   CSN: [id number] Arrival date & time: 05/16/19  2376     History Chief Complaint  Patient presents with  . Knee Pain    Jesus Bradford is a 54 y.o. male.  He is complaining of left knee injury that is work-related.  He said he was moving equipment inside a truck and pivoted felt a pop in his left knee with immediate pain.  Rates the pain is 8 out of 10 worse with movement.  Not associate with any numbness or weakness distal.  Prior history of meniscal tear with surgery along with ACL chronic tear.  Denies any other injuries or complaints.  Does not want any medication here.  The history is provided by the patient.  Knee Pain Location:  Knee Injury: yes   Mechanism of injury comment:  Twisted Knee location:  L knee Pain details:    Quality:  Aching and sharp   Radiates to:  Does not radiate   Severity:  Severe   Onset quality:  Sudden   Timing:  Constant   Progression:  Unchanged Chronicity:  New Dislocation: no   Foreign body present:  No foreign bodies Relieved by:  Nothing Worsened by:  Bearing weight, extension, flexion and activity Ineffective treatments:  Elevation Associated symptoms: decreased ROM   Associated symptoms: no back pain, no fever, no muscle weakness, no numbness and no tingling        Past Medical History:  Diagnosis Date  . Concussion    football  . Hypogonadism male   . Migraines   . OSA (obstructive sleep apnea)   . Sleep apnea     Patient Active Problem List   Diagnosis Date Noted  . Morbid obesity ([place]) 06/20/2011  . OBSTRUCTIVE SLEEP APNEA 05/12/2008    Past Surgical History:  Procedure Laterality Date  . NO PAST SURGERIES         Family History  Problem Relation Age of Onset  . Diabetes Father   . Allergy (severe) Father   . Migraines Mother   . Asthma Mother     Social History   Tobacco Use  . Smoking status: Former Smoker    Packs/day: 1.00    Years: 10.00      Pack years: 10.00    Types: Cigarettes    Quit date: 01/28/2003    Years since quitting: 16.3  . Smokeless tobacco: Never Used  Substance Use Topics  . Alcohol use: Yes    Comment: SOCIALLY   . Drug use: No    Home Medications Prior to Admission medications   Medication Sig Start Date End Date Taking? Authorizing Provider  benzonatate (TESSALON) 100 MG capsule Take 1 capsule (100 mg total) by mouth every 8 (eight) hours. 11/27/18   Dorie Rank, MD  diltiazem (DILACOR XR) 180 MG 24 hr capsule Take 180 mg by mouth daily.    [provider]  eletriptan (RELPAX) 20 MG tablet Take 20 mg by mouth as needed for migraine or headache. May repeat in 2 hours if headache persists or recurs.    [provider]  rosuvastatin (CRESTOR) 20 MG tablet Take 20 mg by mouth once a week. 11/26/18   [provider]    Allergies    Patient has no known allergies.  Review of Systems   Review of Systems  Constitutional: Negative for fever.  Musculoskeletal: Negative for back pain.  Skin: Negative for wound.  Neurological: Negative for weakness and numbness.  Physical Exam Updated Vital Signs BP (!) 147/77   Pulse 80   Temp 97.9 F (36.6 C) (Oral)   Resp 18   Ht 6' (1.829 m)   Wt 135.2 kg   SpO2 100%   BMI 40.42 kg/m   Physical Exam Vitals and nursing note reviewed.  Constitutional:      Appearance: He is well-developed.  HENT:     Head: Normocephalic and atraumatic.  Eyes:     Conjunctiva/sclera: Conjunctivae normal.  Pulmonary:     Effort: Pulmonary effort is normal.  Musculoskeletal:        General: Tenderness and signs of injury present.     Cervical back: Neck supple.     Right lower leg: No edema.     Left lower leg: No edema.     Comments: Left leg nontender hip ankle foot.  Left knee is diffusely tender anterior and medial and especially along the left medial joint line.  No significant effusion.  Patella midline and extensor mechanism intact.   Calf supple without any cords.  Distal pulses and sensation motor intact.  Skin:    General: Skin is warm and dry.     Capillary Refill: Capillary refill takes less than 2 seconds.  Neurological:     General: No focal deficit present.     Mental Status: He is alert.     GCS: GCS eye subscore is 4. GCS verbal subscore is 5. GCS motor subscore is 6.     Sensory: No sensory deficit.     Motor: No weakness.     ED Results / Procedures / Treatments   Labs (all labs ordered are listed, but only abnormal results are displayed) Labs Reviewed - No data to display  EKG None  Radiology DG Knee Complete 4 Views Left  Result Date: 05/16/2019 CLINICAL DATA:  Left knee pain. Twisting knee injury. EXAM: LEFT KNEE - COMPLETE 4+ VIEW COMPARISON:  None. FINDINGS: Age advanced tricompartmental degenerative changes with early joint space narrowing and spurring. Marked peaking of the tibial spines. No acute fracture or osteochondral lesion. No definite joint effusion. 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 No acute bony findings. Electronically Signed   By: Rudie Meyer M.D.   On: 05/16/2019 09:40    Procedures Procedures (including critical care time)  Medications Ordered in ED Medications - No data to display  ED Course  I have reviewed the triage vital signs and the nursing notes.  Pertinent labs & imaging results that were available during my care of the patient were reviewed by me and considered in my medical decision making (see chart for details).  Clinical Course as of May 16 1722  Mon May 16, 2019  0945 Left knee x-ray interpreted by me as no fracture or dislocation.  Does have degenerative changes.   [MB]    Clinical Course User Index [MB] Terrilee Files, MD   MDM Rules/Calculators/A&P                     Differential diagnosis includes fracture, dislocation, meniscal tear, ligament disruption patellar tendon tear Final Clinical Impression(s) / ED  Diagnoses Final diagnoses:  Acute pain of left knee    Rx / DC Orders ED Discharge Orders         Ordered    ibuprofen (ADVIL) 800 MG tablet  Every 8 hours PRN     05/16/19 0952    HYDROcodone-acetaminophen (NORCO/VICODIN) 5-325 MG tablet  Every 6 hours PRN  05/16/19 7824           Terrilee Files, MD 05/16/19 1725

## 2019-05-16 NOTE — Discharge Instructions (Addendum)
You were seen in the emergency department for acute pain in your left knee after pivoting.  Your x-rays did not show any fracture or dislocation but did show a lot of arthritic changes.  There is likely some damage to the meniscus or ligaments.  Please use the knee immobilizer and crutches for comfort.  Ice.  Ibuprofen 3 times a day with food on your stomach.  Pain medicine if needed.  Contact your orthopedic doctor for close follow-up.  IMPRESSION:  Age advanced tricompartmental degenerative changes.     No acute bony findings.

## 2019-09-21 ENCOUNTER — Encounter (HOSPITAL_BASED_OUTPATIENT_CLINIC_OR_DEPARTMENT_OTHER): Payer: Self-pay | Admitting: Orthopaedic Surgery

## 2019-09-21 ENCOUNTER — Other Ambulatory Visit: Payer: Self-pay | Admitting: Orthopaedic Surgery

## 2019-09-21 ENCOUNTER — Other Ambulatory Visit: Payer: Self-pay

## 2019-09-23 ENCOUNTER — Other Ambulatory Visit (HOSPITAL_COMMUNITY): Payer: Self-pay

## 2019-09-24 ENCOUNTER — Other Ambulatory Visit (HOSPITAL_COMMUNITY)
Admission: RE | Admit: 2019-09-24 | Discharge: 2019-09-24 | Disposition: A | Discharge status: Home / Self Care | Payer: 59 | Source: Ambulatory Visit | Attending: Orthopaedic Surgery | Admitting: Orthopaedic Surgery

## 2019-09-24 DIAGNOSIS — Z01812 Encounter for preprocedural laboratory examination: Secondary | ICD-10-CM | POA: Diagnosis present

## 2019-09-24 DIAGNOSIS — Z20822 Contact with and (suspected) exposure to covid-19: Secondary | ICD-10-CM | POA: Diagnosis not present

## 2019-09-24 LAB — SARS CORONAVIRUS 2 (TAT 6-24 HRS): SARS Coronavirus 2: NEGATIVE

## 2019-09-26 NOTE — H&P (Signed)
Jesus Bradford is an 54 y.o. male.   Chief Complaint: left knee pain and instability HPI: left knee pain and instability  Mayer has been working with physical therapy.  He is also wearing a brace.  Unfortunately he persists with instability.  He says his knee will buckle and give way.  He does not trust his knee and wants something done so he can be a bit more active. He has been ACL deficient for many years.    Past Medical History:  Diagnosis Date  . Concussion    football  . Hypertension   . Hypogonadism male   . Migraines   . OSA (obstructive sleep apnea)   . Sleep apnea     Past Surgical History:  Procedure Laterality Date  . KNEE SURGERY Left    2017  . NO PAST SURGERIES      Family History  Problem Relation Age of Onset  . Diabetes Father   . Allergy (severe) Father   . Migraines Mother   . Asthma Mother    Social History:  reports that he has been smoking cigarettes and cigars. He has a 10.00 pack-year smoking history. He has never used smokeless tobacco. He reports current alcohol use. He reports that he does not use drugs.  Allergies: No Known Allergies  No medications prior to admission.    No results found for this or any previous visit (from the past 48 hour(s)). No results found.  Review of Systems  Musculoskeletal: Positive for arthralgias.       Left knee pain and instability  All other systems reviewed and are negative.   Height 6' (1.829 m), weight 136.1 kg. Physical Exam Constitutional:      Appearance: Normal appearance.  HENT:     Head: Normocephalic and atraumatic.     Mouth/Throat:     Mouth: Mucous membranes are moist.     Pharynx: Oropharynx is clear.  Eyes:     Extraocular Movements: Extraocular movements intact.  Cardiovascular:     Rate and Rhythm: Normal rate.  Pulmonary:     Effort: Pulmonary effort is normal.  Abdominal:     Palpations: Abdomen is soft.  Musculoskeletal:     Cervical back: Normal range of motion.      Comments: Left knee has no effusion.  Motion is full.  He has some laxity to Lachman's and drawer tests.  He has good stability to varus and valgus stress.  I do not get any joint line pain today.  Hip motion is full and straight leg raise is negative.   Skin:    General: Skin is warm and dry.  Neurological:     General: No focal deficit present.     Mental Status: He is alert and oriented to person, place, and time.  Psychiatric:        Mood and Affect: Mood normal.        Behavior: Behavior normal.        Thought Content: Thought content normal.        Judgment: Judgment normal.      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 Thurston Hole 2017 and MRI 2021  Plan: Hezzie persists with instability about his knee.  He has been through physical therapy and bracing.  He does not trust the knee.  I think we can help him with an allograft ACL reconstruction.  I reviewed risk of anesthesia, infection, DVT and stressed the importance of postoperative physical  therapy.  I did stress with him that he does have some mild to moderate degenerative change and at some point down the road might even need knee replacement surgery but his greatest problem at this point is instability.  Ginger Organ Mireille Lacombe, PA-C 09/26/2019, 10:04 AM

## 2019-09-27 ENCOUNTER — Encounter (HOSPITAL_BASED_OUTPATIENT_CLINIC_OR_DEPARTMENT_OTHER)
Admission: RE | Disposition: A | Discharge status: Home / Self Care | Payer: Self-pay | Source: Home / Self Care | Attending: Orthopaedic Surgery

## 2019-09-27 ENCOUNTER — Encounter (HOSPITAL_BASED_OUTPATIENT_CLINIC_OR_DEPARTMENT_OTHER): Payer: Self-pay | Admitting: Orthopaedic Surgery

## 2019-09-27 ENCOUNTER — Ambulatory Visit (HOSPITAL_BASED_OUTPATIENT_CLINIC_OR_DEPARTMENT_OTHER): Payer: 59 | Admitting: Anesthesiology

## 2019-09-27 ENCOUNTER — Other Ambulatory Visit: Payer: Self-pay

## 2019-09-27 ENCOUNTER — Ambulatory Visit (HOSPITAL_BASED_OUTPATIENT_CLINIC_OR_DEPARTMENT_OTHER)
Admission: RE | Admit: 2019-09-27 | Discharge: 2019-09-27 | Disposition: A | Discharge status: Home / Self Care | Payer: 59 | Attending: Orthopaedic Surgery | Admitting: Orthopaedic Surgery

## 2019-09-27 DIAGNOSIS — I10 Essential (primary) hypertension: Secondary | ICD-10-CM | POA: Insufficient documentation

## 2019-09-27 DIAGNOSIS — F17290 Nicotine dependence, other tobacco product, uncomplicated: Secondary | ICD-10-CM | POA: Diagnosis not present

## 2019-09-27 DIAGNOSIS — Z6841 Body Mass Index (BMI) 40.0 and over, adult: Secondary | ICD-10-CM | POA: Diagnosis not present

## 2019-09-27 DIAGNOSIS — G4733 Obstructive sleep apnea (adult) (pediatric): Secondary | ICD-10-CM | POA: Insufficient documentation

## 2019-09-27 DIAGNOSIS — Z20822 Contact with and (suspected) exposure to covid-19: Secondary | ICD-10-CM | POA: Insufficient documentation

## 2019-09-27 DIAGNOSIS — X58XXXA Exposure to other specified factors, initial encounter: Secondary | ICD-10-CM | POA: Insufficient documentation

## 2019-09-27 DIAGNOSIS — F17210 Nicotine dependence, cigarettes, uncomplicated: Secondary | ICD-10-CM | POA: Insufficient documentation

## 2019-09-27 DIAGNOSIS — S83512A Sprain of anterior cruciate ligament of left knee, initial encounter: Secondary | ICD-10-CM | POA: Insufficient documentation

## 2019-09-27 HISTORY — DX: Essential (primary) hypertension: I10

## 2019-09-27 HISTORY — PX: ANTERIOR CRUCIATE LIGAMENT REPAIR: SHX115

## 2019-09-27 SURGERY — RECONSTRUCTION, KNEE, ACL
Anesthesia: General | Site: Knee | Laterality: Left

## 2019-09-27 MED ORDER — ONDANSETRON HCL 4 MG/2ML IJ SOLN
INTRAMUSCULAR | Status: DC | PRN
  Administered 2019-09-27: 4 mg via INTRAVENOUS

## 2019-09-27 MED ORDER — BUPIVACAINE LIPOSOME 1.3 % IJ SUSP
INTRAMUSCULAR | Status: DC | PRN
  Administered 2019-09-27: 10 mL via PERINEURAL

## 2019-09-27 MED ORDER — PROPOFOL 10 MG/ML IV BOLUS
INTRAVENOUS | Status: AC
  Filled 2019-09-27: qty 20

## 2019-09-27 MED ORDER — PHENYLEPHRINE 40 MCG/ML (10ML) SYRINGE FOR IV PUSH (FOR BLOOD PRESSURE SUPPORT)
PREFILLED_SYRINGE | INTRAVENOUS | Status: AC
  Filled 2019-09-27: qty 10

## 2019-09-27 MED ORDER — BUPIVACAINE-EPINEPHRINE (PF) 0.5% -1:200000 IJ SOLN
INTRAMUSCULAR | Status: DC | PRN
  Administered 2019-09-27: 20 mL via PERINEURAL

## 2019-09-27 MED ORDER — LACTATED RINGERS IV SOLN: INTRAVENOUS | Status: DC

## 2019-09-27 MED ORDER — FENTANYL CITRATE (PF) 100 MCG/2ML IJ SOLN
INTRAMUSCULAR | Status: AC
  Filled 2019-09-27: qty 2

## 2019-09-27 MED ORDER — FENTANYL CITRATE (PF) 100 MCG/2ML IJ SOLN
INTRAMUSCULAR | Status: AC
Start: 2019-09-27 — End: ?
  Filled 2019-09-27: qty 2

## 2019-09-27 MED ORDER — MIDAZOLAM HCL 2 MG/2ML IJ SOLN
INTRAMUSCULAR | Status: AC
  Filled 2019-09-27: qty 2

## 2019-09-27 MED ORDER — DEXTROSE 5 % IV SOLN
3.0000 g | INTRAVENOUS | Status: AC
  Administered 2019-09-27: 3 g via INTRAVENOUS

## 2019-09-27 MED ORDER — FENTANYL CITRATE (PF) 100 MCG/2ML IJ SOLN
INTRAMUSCULAR | Status: DC | PRN
  Administered 2019-09-27 (×2): 25 ug via INTRAVENOUS

## 2019-09-27 MED ORDER — KETOROLAC TROMETHAMINE 30 MG/ML IJ SOLN
INTRAMUSCULAR | Status: AC
  Filled 2019-09-27: qty 1

## 2019-09-27 MED ORDER — FENTANYL CITRATE (PF) 100 MCG/2ML IJ SOLN
25.0000 ug | INTRAMUSCULAR | Status: DC | PRN
  Administered 2019-09-27: 50 ug via INTRAVENOUS

## 2019-09-27 MED ORDER — CEFAZOLIN SODIUM-DEXTROSE 1-4 GM/50ML-% IV SOLN
INTRAVENOUS | Status: AC
  Filled 2019-09-27: qty 50

## 2019-09-27 MED ORDER — DEXAMETHASONE SODIUM PHOSPHATE 10 MG/ML IJ SOLN
INTRAMUSCULAR | Status: AC
  Filled 2019-09-27: qty 1

## 2019-09-27 MED ORDER — ACETAMINOPHEN 160 MG/5ML PO SOLN: 325.0000 mg | ORAL | Status: DC | PRN

## 2019-09-27 MED ORDER — PHENYLEPHRINE 40 MCG/ML (10ML) SYRINGE FOR IV PUSH (FOR BLOOD PRESSURE SUPPORT)
PREFILLED_SYRINGE | INTRAVENOUS | Status: DC | PRN
  Administered 2019-09-27: 80 ug via INTRAVENOUS
  Administered 2019-09-27: 40 ug via INTRAVENOUS
  Administered 2019-09-27 (×2): 80 ug via INTRAVENOUS

## 2019-09-27 MED ORDER — KETOROLAC TROMETHAMINE 30 MG/ML IJ SOLN
INTRAMUSCULAR | Status: DC | PRN
  Administered 2019-09-27: 30 mg via INTRAVENOUS

## 2019-09-27 MED ORDER — PROPOFOL 10 MG/ML IV BOLUS
INTRAVENOUS | Status: DC | PRN
  Administered 2019-09-27: 100 mg via INTRAVENOUS
  Administered 2019-09-27: 200 mg via INTRAVENOUS

## 2019-09-27 MED ORDER — OXYCODONE HCL 5 MG PO TABS
ORAL_TABLET | ORAL | Status: AC
  Filled 2019-09-27: qty 1

## 2019-09-27 MED ORDER — MIDAZOLAM HCL 2 MG/2ML IJ SOLN
2.0000 mg | Freq: Once | INTRAMUSCULAR | Status: AC
  Administered 2019-09-27: 2 mg via INTRAVENOUS

## 2019-09-27 MED ORDER — ACETAMINOPHEN 500 MG PO TABS
ORAL_TABLET | ORAL | Status: AC
  Filled 2019-09-27: qty 2

## 2019-09-27 MED ORDER — POVIDONE-IODINE 10 % EX SWAB
2.0000 "application " | Freq: Once | CUTANEOUS | Status: AC
  Administered 2019-09-27: 2 via TOPICAL

## 2019-09-27 MED ORDER — HYDROCODONE-ACETAMINOPHEN 5-325 MG PO TABS: 1.0000 | ORAL_TABLET | Freq: Four times a day (QID) | ORAL | 0 refills | Status: AC | PRN

## 2019-09-27 MED ORDER — DEXAMETHASONE SODIUM PHOSPHATE 10 MG/ML IJ SOLN
INTRAMUSCULAR | Status: DC | PRN
  Administered 2019-09-27: 10 mg via INTRAVENOUS

## 2019-09-27 MED ORDER — EPHEDRINE 5 MG/ML INJ
INTRAVENOUS | Status: AC
  Filled 2019-09-27: qty 10

## 2019-09-27 MED ORDER — OXYCODONE HCL 5 MG/5ML PO SOLN: 5.0000 mg | Freq: Once | ORAL | Status: AC | PRN

## 2019-09-27 MED ORDER — LIDOCAINE 2% (20 MG/ML) 5 ML SYRINGE
INTRAMUSCULAR | Status: DC | PRN
  Administered 2019-09-27: 80 mg via INTRAVENOUS

## 2019-09-27 MED ORDER — EPHEDRINE SULFATE-NACL 50-0.9 MG/10ML-% IV SOSY
PREFILLED_SYRINGE | INTRAVENOUS | Status: DC | PRN
  Administered 2019-09-27: 10 mg via INTRAVENOUS
  Administered 2019-09-27 (×2): 5 mg via INTRAVENOUS

## 2019-09-27 MED ORDER — ONDANSETRON HCL 4 MG/2ML IJ SOLN: 4.0000 mg | Freq: Once | INTRAMUSCULAR | Status: DC | PRN

## 2019-09-27 MED ORDER — CEFAZOLIN SODIUM-DEXTROSE 2-4 GM/100ML-% IV SOLN
INTRAVENOUS | Status: AC
  Filled 2019-09-27: qty 100

## 2019-09-27 MED ORDER — OXYCODONE HCL 5 MG PO TABS
5.0000 mg | ORAL_TABLET | Freq: Once | ORAL | Status: AC | PRN
  Administered 2019-09-27: 5 mg via ORAL

## 2019-09-27 MED ORDER — FENTANYL CITRATE (PF) 100 MCG/2ML IJ SOLN
100.0000 ug | Freq: Once | INTRAMUSCULAR | Status: AC
  Administered 2019-09-27: 100 ug via INTRAVENOUS

## 2019-09-27 MED ORDER — ONDANSETRON HCL 4 MG/2ML IJ SOLN
INTRAMUSCULAR | Status: AC
  Filled 2019-09-27: qty 2

## 2019-09-27 MED ORDER — SODIUM CHLORIDE 0.9 % IR SOLN
Status: DC | PRN
  Administered 2019-09-27 (×5): 3000 mL

## 2019-09-27 MED ORDER — MEPERIDINE HCL 25 MG/ML IJ SOLN: 6.2500 mg | INTRAMUSCULAR | Status: DC | PRN

## 2019-09-27 MED ORDER — LIDOCAINE 2% (20 MG/ML) 5 ML SYRINGE
INTRAMUSCULAR | Status: AC
  Filled 2019-09-27: qty 5

## 2019-09-27 MED ORDER — ACETAMINOPHEN 325 MG PO TABS: 325.0000 mg | ORAL_TABLET | ORAL | Status: DC | PRN

## 2019-09-27 MED ORDER — ACETAMINOPHEN 500 MG PO TABS
1000.0000 mg | ORAL_TABLET | Freq: Once | ORAL | Status: AC
  Administered 2019-09-27: 1000 mg via ORAL

## 2019-09-27 SURGICAL SUPPLY — 67 items
APL SKNCLS STERI-STRIP NONHPOA (GAUZE/BANDAGES/DRESSINGS)
BANDAGE ESMARK 6X9 LF (GAUZE/BANDAGES/DRESSINGS) IMPLANT
BENZOIN TINCTURE PRP APPL 2/3 (GAUZE/BANDAGES/DRESSINGS) IMPLANT
BLADE AVERAGE 25X9 (BLADE) IMPLANT
BLADE EXCALIBUR 4.0X13 (MISCELLANEOUS) ×2 IMPLANT
BLADE SURG 15 STRL LF DISP TIS (BLADE) ×1 IMPLANT
BLADE SURG 15 STRL SS (BLADE) ×2
BNDG CMPR 9X6 STRL LF SNTH (GAUZE/BANDAGES/DRESSINGS)
BNDG ELASTIC 6X5.8 VLCR STR LF (GAUZE/BANDAGES/DRESSINGS) ×2 IMPLANT
BNDG ESMARK 6X9 LF (GAUZE/BANDAGES/DRESSINGS)
BNDG GAUZE ELAST 4 BULKY (GAUZE/BANDAGES/DRESSINGS) ×2 IMPLANT
BONE TUNNEL PLUG CANNULATED (MISCELLANEOUS) IMPLANT
BURR OVAL 8 FLU 4.0X13 (MISCELLANEOUS) ×2 IMPLANT
COVER BACK TABLE 60X90IN (DRAPES) ×2 IMPLANT
COVER WAND RF STERILE (DRAPES) IMPLANT
CUFF TOURN SGL QUICK 34 (TOURNIQUET CUFF)
CUFF TRNQT CYL 34X4.125X (TOURNIQUET CUFF) IMPLANT
DECANTER SPIKE VIAL GLASS SM (MISCELLANEOUS) IMPLANT
DRAPE ARTHROSCOPY W/POUCH 90 (DRAPES) ×2 IMPLANT
DRAPE INCISE IOBAN 66X45 STRL (DRAPES) ×2 IMPLANT
DRAPE U-SHAPE 47X51 STRL (DRAPES) ×2 IMPLANT
DRSG EMULSION OIL 3X3 NADH (GAUZE/BANDAGES/DRESSINGS) ×4 IMPLANT
DURAPREP 26ML APPLICATOR (WOUND CARE) ×2 IMPLANT
ELECT MENISCUS 165MM 90D (ELECTRODE) IMPLANT
ELECT REM PT RETURN 9FT ADLT (ELECTROSURGICAL)
ELECTRODE REM PT RTRN 9FT ADLT (ELECTROSURGICAL) IMPLANT
GAUZE 4X4 16PLY RFD (DISPOSABLE) IMPLANT
GAUZE SPONGE 4X4 12PLY STRL (GAUZE/BANDAGES/DRESSINGS) ×2 IMPLANT
GLOVE BIO SURGEON STRL SZ8 (GLOVE) ×4 IMPLANT
GLOVE BIOGEL PI IND STRL 8 (GLOVE) ×2 IMPLANT
GLOVE BIOGEL PI INDICATOR 8 (GLOVE) ×2
GOWN STRL REUS W/ TWL LRG LVL3 (GOWN DISPOSABLE) ×1 IMPLANT
GOWN STRL REUS W/ TWL XL LVL3 (GOWN DISPOSABLE) ×2 IMPLANT
GOWN STRL REUS W/TWL LRG LVL3 (GOWN DISPOSABLE) ×2
GOWN STRL REUS W/TWL XL LVL3 (GOWN DISPOSABLE) ×4
IMMOBILIZER KNEE 22 UNIV (SOFTGOODS) ×2 IMPLANT
IMMOBILIZER KNEE 24 THIGH 36 (MISCELLANEOUS) IMPLANT
IMMOBILIZER KNEE 24 UNIV (MISCELLANEOUS)
IV NS IRRIG 3000ML ARTHROMATIC (IV SOLUTION) IMPLANT
KNEE WRAP E Z 3 GEL PACK (MISCELLANEOUS) IMPLANT
MANIFOLD NEPTUNE II (INSTRUMENTS) ×2 IMPLANT
NS IRRIG 1000ML POUR BTL (IV SOLUTION) ×2 IMPLANT
PACK ARTHROSCOPY DSU (CUSTOM PROCEDURE TRAY) ×2 IMPLANT
PACK BASIN DAY SURGERY FS (CUSTOM PROCEDURE TRAY) ×2 IMPLANT
PATELLA LIGAMENT BISECTED FR (Tissue) ×2 IMPLANT
PENCIL SMOKE EVACUATOR (MISCELLANEOUS) IMPLANT
PORT APPOLLO RF 90DEGREE MULTI (SURGICAL WAND) IMPLANT
SCREW PROPEL 7X20MM (Screw) ×2 IMPLANT
SCREW PROPEL 8X20 (Screw) ×2 IMPLANT
SHEET MEDIUM DRAPE 40X70 STRL (DRAPES) ×2 IMPLANT
SLEEVE SCD COMPRESS KNEE MED (MISCELLANEOUS) ×2 IMPLANT
SPONGE LAP 4X18 RFD (DISPOSABLE) ×2 IMPLANT
STRIP CLOSURE SKIN 1/2X4 (GAUZE/BANDAGES/DRESSINGS) IMPLANT
SUCTION FRAZIER HANDLE 10FR (MISCELLANEOUS)
SUCTION TUBE FRAZIER 10FR DISP (MISCELLANEOUS) IMPLANT
SUT ETHILON 4 0 PS 2 18 (SUTURE) ×2 IMPLANT
SUT PDS AB 1 CT  36 (SUTURE) ×2
SUT PDS AB 1 CT 36 (SUTURE) ×1 IMPLANT
SUT STEEL 5 (SUTURE) ×2 IMPLANT
SUT VIC AB 0 CT1 27 (SUTURE)
SUT VIC AB 0 CT1 27XBRD ANBCTR (SUTURE) IMPLANT
SUT VIC AB 2-0 SH 27 (SUTURE) ×2
SUT VIC AB 2-0 SH 27XBRD (SUTURE) ×1 IMPLANT
SUT VIC AB 3-0 FS2 27 (SUTURE) ×2 IMPLANT
SYR 3ML 18GX1 1/2 (SYRINGE) IMPLANT
TOWEL GREEN STERILE FF (TOWEL DISPOSABLE) ×2 IMPLANT
TUBING ARTHROSCOPY IRRIG 16FT (MISCELLANEOUS) ×2 IMPLANT

## 2019-09-27 NOTE — Progress Notes (Signed)
Assisted Dr. Carswell Jackson with left, ultrasound guided, adductor canal block. Side rails up, monitors on throughout procedure. See vital signs in flow sheet. Tolerated Procedure well.  

## 2019-09-27 NOTE — Discharge Instructions (Signed)
No Tylenol until 6:51 pm   Post Anesthesia Home Care Instructions  Activity: Get plenty of rest for the remainder of the day. A responsible individual must stay with you for 24 hours following the procedure.  For the next 24 hours, DO NOT: -Drive a car -Advertising copywriter -Drink alcoholic beverages -Take any medication unless instructed by your physician -Make any legal decisions or sign important papers.  Meals: Start with liquid foods such as gelatin or soup. Progress to regular foods as tolerated. Avoid greasy, spicy, heavy foods. If nausea and/or vomiting occur, drink only clear liquids until the nausea and/or vomiting subsides. Call your physician if vomiting continues.  Special Instructions/Symptoms: Your throat may feel dry or sore from the anesthesia or the breathing tube placed in your throat during surgery. If this causes discomfort, gargle with warm salt water. The discomfort should disappear within 24 hours.  If you had a scopolamine patch placed behind your ear for the management of post- operative nausea and/or vomiting:  1. The medication in the patch is effective for 72 hours, after which it should be removed.  Wrap patch in a tissue and discard in the trash. Wash hands thoroughly with soap and water. 2. You may remove the patch earlier than 72 hours if you experience unpleasant side effects which may include dry mouth, dizziness or visual disturbances. 3. Avoid touching the patch. Wash your hands with soap and water after contact with the patch.    Regional Anesthesia Blocks  1. Numbness or the inability to move the "blocked" extremity may last from 3-48 hours after placement. The length of time depends on the medication injected and your individual response to the medication. If the numbness is not going away after 48 hours, call your surgeon.  2. The extremity that is blocked will need to be protected until the numbness is gone and the  Strength has returned. Because  you cannot feel it, you will need to take extra care to avoid injury. Because it may be weak, you may have difficulty moving it or using it. You may not know what position it is in without looking at it while the block is in effect.  3. For blocks in the legs and feet, returning to weight bearing and walking needs to be done carefully. You will need to wait until the numbness is entirely gone and the strength has returned. You should be able to move your leg and foot normally before you try and bear weight or walk. You will need someone to be with you when you first try to ensure you do not fall and possibly risk injury.  4. Bruising and tenderness at the needle site are common side effects and will resolve in a few days.  5. Persistent numbness or new problems with movement should be communicated to the surgeon or the [facility] [phone] [facility] [phone]).

## 2019-09-27 NOTE — Op Note (Signed)
PRE-OP DIAGNOSIS:  ACL tear left knee and TMM POST-OP DIAGNOSIS:  same  PROCEDURE:  ACL reconstruction  left knee  and PMM SURGEON:  Marcene Corning MD ASSISTANT: Elodia Florence PA ANESTHESIA:  General and block  INDICATION FOR PROCEDURE:  Jesus Bradford is a 54 y.o. male with an unstable knee.  The patient has failed non-operative measures and has a knee that does not allow for participation in desired activities.  The patient is offered ACL reconstruction in hopes of stabilizing the knee.  Associated conditions are to be addressed as well.  Informed operative consent was obtained after discussion of risks including reaction to anesthesia, infection, DVT, and stiffness.  The importance of the post-operative rehabilitation protocol to optimize result was stressed extensively with the patient.  SUMMARY OF FINDINGS AND PROCEDURE:  Jesus Bradford was taken to the operative suite where under the above anesthesia a knee arthroscopy and ACL reconstruction was performed. The suprapatellar pouch was benign while the patellofemoral joint showed no articular cartilage damage.  The medial compartment was notable for no articular cartilage damage and posterior horn displaceable tear meniscal pathology.  The ACL was torn and the PCL was intact.  The lateral compartment was notable for no articular cartilage damage and absence of most of the meniscus meniscal pathology.  The meniscal and articular cartilage problems were addressed with PMM removing 10% of the structure. We used patellar tendon allograft material and stabilized at both ends with metal L[employer] screws.   Elodia Florence PA assisted throughout and was invaluable to the completion of the case in that he positioned and retracted and also fashioned the graft on the back table while I performed arthroscopic portions of the case thereby significantly minimizing OR time.  The patient was scheduled to stay overnight at but might go home depending on condition in the recovery  room.  DESCRIPTION OF PROCEDURE:  Jesus Bradford was taken to the operative suite where the above anesthetic was applied.  The patient was positioned supine and prepped and draped in normal sterile fashion.  An appropriate time out was performed.  After the administration of kefzol pre-operative antibiotic and arthroscopy of the knee was performed. Findings were as noted above and appropriate articular and meniscal cartilage work was done.  The ACL reconstruction was then performed utilizing the above mentioned material.  We harvested the middle third of the patellar tendon through a longitudinal incision and dissection through peritenon.  A saw was used to create contiguous bone plugs from the tibial tubercle and patella. A conservative notch-plasty was done with a burr.  A tourniquet was not utilized.  We prepared the aforementioned graft with saw and drill to fit through planned tunnels and bone plugs were fashioned to be one mm smaller than tunnels.  A guide was placed in the knee anterior to the PCL near the ACL footprint and utilized to place a guide wire up into the knee.  Over this I reamed to a diameter of 11 mm.  A second guide was placed through the medial portal low on the femur at the ACL footprint there and utilized for placement of a guide pin through the femur and out the lateral thigh.  Over this I reamed a femoral tunnel to a diameter of 10 mm and depth of 2 cm.  Bony debris was removed from the knee with the shaver.  The aforementioned graft was pulled through the tibial tunnel into the femoral tunnel with care taken to keep the tendinous portion of the  graft in an anterior position as it entered the femoral tunnel.  I placed a guidewire anterior in the femoral tunnel and over this placed a 7x20 mm interference screw.  The knee was ranged and the graft was felt to be very isometric.  Another guidewire was placed through the tibial tunnel and seen to enter the knee arthroscopically.  Over this I  placed another interference screw which was  8x20 mm in size.  The knee was again ranged and easily came to full extension with no impingement.  Arthroscopic equipment was removed at this point.  In case of patellar tendon autograft peritenon was closed with #0 vicryl followed by subcutaneous re-approximation in both allograft and autograft cases using 2-0 undyed vicryl and skin closure with nylon.  Adaptic was applied along with a sterile dressing.  Estimated blood loss and intraoperative fluids can be obtained from anesthesia records.  DISPOSITION:  The patient was extubated in the operating room and taken to recovery room in stable condition.  Plans were to go home same day depending on condition in recovery.    Jesus Bradford 09/27/2019, 2:22 PM

## 2019-09-27 NOTE — Anesthesia Procedure Notes (Addendum)
Anesthesia Regional Block: Adductor canal block   Pre-Anesthetic Checklist: ,, timeout performed, Correct Patient, Correct Site, Correct Laterality, Correct Procedure, Correct Position, site marked, Risks and benefits discussed,  Surgical consent,  Pre-op evaluation,  At surgeon's request and post-op pain management  Laterality: Left and Lower  Prep: chloraprep       Needles:  Injection technique: Single-shot  Needle Type: Echogenic Needle     Needle Length: 9cm  Needle Gauge: 21     Additional Needles:   Procedures:,,,, ultrasound used (permanent image in chart),,,,  Narrative:  Start time: 09/27/2019 12:42 PM End time: 09/27/2019 12:48 PM Injection made incrementally with aspirations every 5 mL.  Performed by: Personally  Anesthesiologist: Jairo Ben, MD  Additional Notes: Pt identified in Holding room.  Monitors applied. Working IV access confirmed. Sterile prep L thigh.  #21ga Echogenic Arrow block needle into adductor canal with US guidance.  20cc 0.5% Bupivacaine with 1:200k epi and exparel injected incrementally after negative test dose.  Patient asymptomatic, VSS, no heme aspirated, tolerated well.  Sandford Craze, MD

## 2019-09-27 NOTE — Anesthesia Procedure Notes (Signed)
Procedure Name: LMA Insertion Date/Time: 09/27/2019 1:17 PM Performed by: [place], Aaria Happ C, CRNA Pre-anesthesia Checklist: Patient identified, Emergency Drugs available, Suction available and Patient being monitored Patient Re-evaluated:Patient Re-evaluated prior to induction Oxygen Delivery Method: Circle system utilized Preoxygenation: Pre-oxygenation with 100% oxygen Induction Type: IV induction Ventilation: Mask ventilation without difficulty LMA: LMA inserted LMA Size: 4.0 Number of attempts: 1 Airway Equipment and Method: Bite block Placement Confirmation: positive ETCO2 Tube secured with: Tape Dental Injury: Teeth and Oropharynx as per pre-operative assessment

## 2019-09-27 NOTE — Anesthesia Postprocedure Evaluation (Signed)
Anesthesia Post Note  Patient: Jesus Bradford  Procedure(s) Performed: PARTIAL MEDIAL MENISCECTOMY, ANTERIOR CRUCIATE LIGAMENT REPAIR, LEFT KNEE (Left Knee)     Patient location during evaluation: Phase II Anesthesia Type: General Level of consciousness: awake and alert, patient cooperative and oriented Pain management: pain level controlled Vital Signs Assessment: post-procedure vital signs reviewed and stable Respiratory status: spontaneous breathing, nonlabored ventilation and respiratory function stable Cardiovascular status: blood pressure returned to baseline and stable Postop Assessment: no apparent nausea or vomiting and adequate PO intake Anesthetic complications: no   No complications documented.  Last Vitals:  Vitals:   09/27/19 1445 09/27/19 1500  BP: 92/69 108/63  Pulse: 66 63  Resp: 13 14  Temp:    SpO2: 99% 96%    Last Pain:  Vitals:   09/27/19 1500  TempSrc:   PainSc: 5                  Selwyn Reason,E. Doyel Mulkern

## 2019-09-27 NOTE — Interval H&P Note (Signed)
History and Physical Interval Note:  09/27/2019 12:38 PM  Jesus Bradford  has presented today for surgery, with the diagnosis of LEFT KNEE ANTERIOR CRUCIATE LIGAMENT TEAR.  The various methods of treatment have been discussed with the patient and family. After consideration of risks, benefits and other options for treatment, the patient has consented to  Procedure(s): LEFT KNEE ANTERIOR CRUCIATE LIGAMENT (ACL RECONSTRUCTION AND CHONDROMALACIA PATELLA (Left) as a surgical intervention.  The patient's history has been reviewed, patient examined, no change in status, stable for surgery.  I have reviewed the patient's chart and labs.  Questions were answered to the patient's satisfaction.     Velna Ochs

## 2019-09-27 NOTE — Anesthesia Preprocedure Evaluation (Addendum)
Anesthesia Evaluation  Patient identified by MRN, date of birth, ID band Patient awake    Reviewed: Allergy & Precautions, NPO status , Patient's Chart, lab work & pertinent test results, reviewed documented beta blocker date and time   History of Anesthesia Complications Negative for: history of anesthetic complications  Airway Mallampati: III  TM Distance: >3 FB Neck ROM: Full    Dental  (+) Dental Advisory Given, Missing   Pulmonary sleep apnea and Continuous Positive Airway Pressure Ventilation , Current Smoker and Patient abstained from smoking.,  09/24/2019 SARS coronavirus NEG   breath sounds clear to auscultation       Cardiovascular hypertension, Pt. on medications and Pt. on home beta blockers (-) angina Rhythm:Regular Rate:Normal     Neuro/Psych  Headaches,    GI/Hepatic negative GI ROS, Neg liver ROS,   Endo/Other  Morbid obesity  Renal/GU negative Renal ROS     Musculoskeletal   Abdominal (+) + obese,   Peds  Hematology negative hematology ROS (+)   Anesthesia Other Findings   Reproductive/Obstetrics                            Anesthesia Physical Anesthesia Plan  ASA: III  Anesthesia Plan: General   Post-op Pain Management: GA combined w/ Regional for post-op pain   Induction: Intravenous  PONV Risk Score and Plan: 1 and Ondansetron and Dexamethasone  Airway Management Planned: LMA  Additional Equipment: None  Intra-op Plan:   Post-operative Plan:   Informed Consent: I have reviewed the patients History and Physical, chart, labs and discussed the procedure including the risks, benefits and alternatives for the proposed anesthesia with the patient or authorized representative who has indicated his/her understanding and acceptance.     Dental advisory given  Plan Discussed with: CRNA and Surgeon  Anesthesia Plan Comments: (Plan routine monitors, GA with  adductor canal block for post op analgesia)       Anesthesia Quick Evaluation

## 2019-09-27 NOTE — Transfer of Care (Signed)
Immediate Anesthesia Transfer of Care Note  Patient: Uno Esau  Procedure(s) Performed: PARTIAL MEDIAL MENISCECTOMY, ANTERIOR CRUCIATE LIGAMENT REPAIR, LEFT KNEE (Left Knee)  Patient Location: PACU  Anesthesia Type:GA combined with regional for post-op pain  Level of Consciousness: awake, alert , oriented, drowsy and patient cooperative  Airway & Oxygen Therapy: Patient Spontanous Breathing and Patient connected to face mask oxygen  Post-op Assessment: Report given to RN and Post -op Vital signs reviewed and stable  Post vital signs: Reviewed  Last Vitals:  Vitals Value Taken Time  BP    Temp    Pulse 67 09/27/19 1440  Resp 12 09/27/19 1440  SpO2 99 % 09/27/19 1440  Vitals shown include unvalidated device data.  Last Pain:  Vitals:   09/27/19 1203  TempSrc: Oral  PainSc: 2       Patients Stated Pain Goal: 2 (09/27/19 1203)  Complications: No complications documented.

## 2019-09-28 ENCOUNTER — Encounter (HOSPITAL_BASED_OUTPATIENT_CLINIC_OR_DEPARTMENT_OTHER): Payer: Self-pay | Admitting: Orthopaedic Surgery

## 2021-01-02 ENCOUNTER — Other Ambulatory Visit (HOSPITAL_COMMUNITY): Payer: Self-pay

## 2021-01-06 IMAGING — DX DG KNEE COMPLETE 4+V*L*
4 series · 4 of 4 positions shown · non-contrast
Comparison: None.

CLINICAL DATA: Left knee pain. Twisting knee injury.

EXAM:
LEFT KNEE - COMPLETE 4+ VIEW

[knee ap]
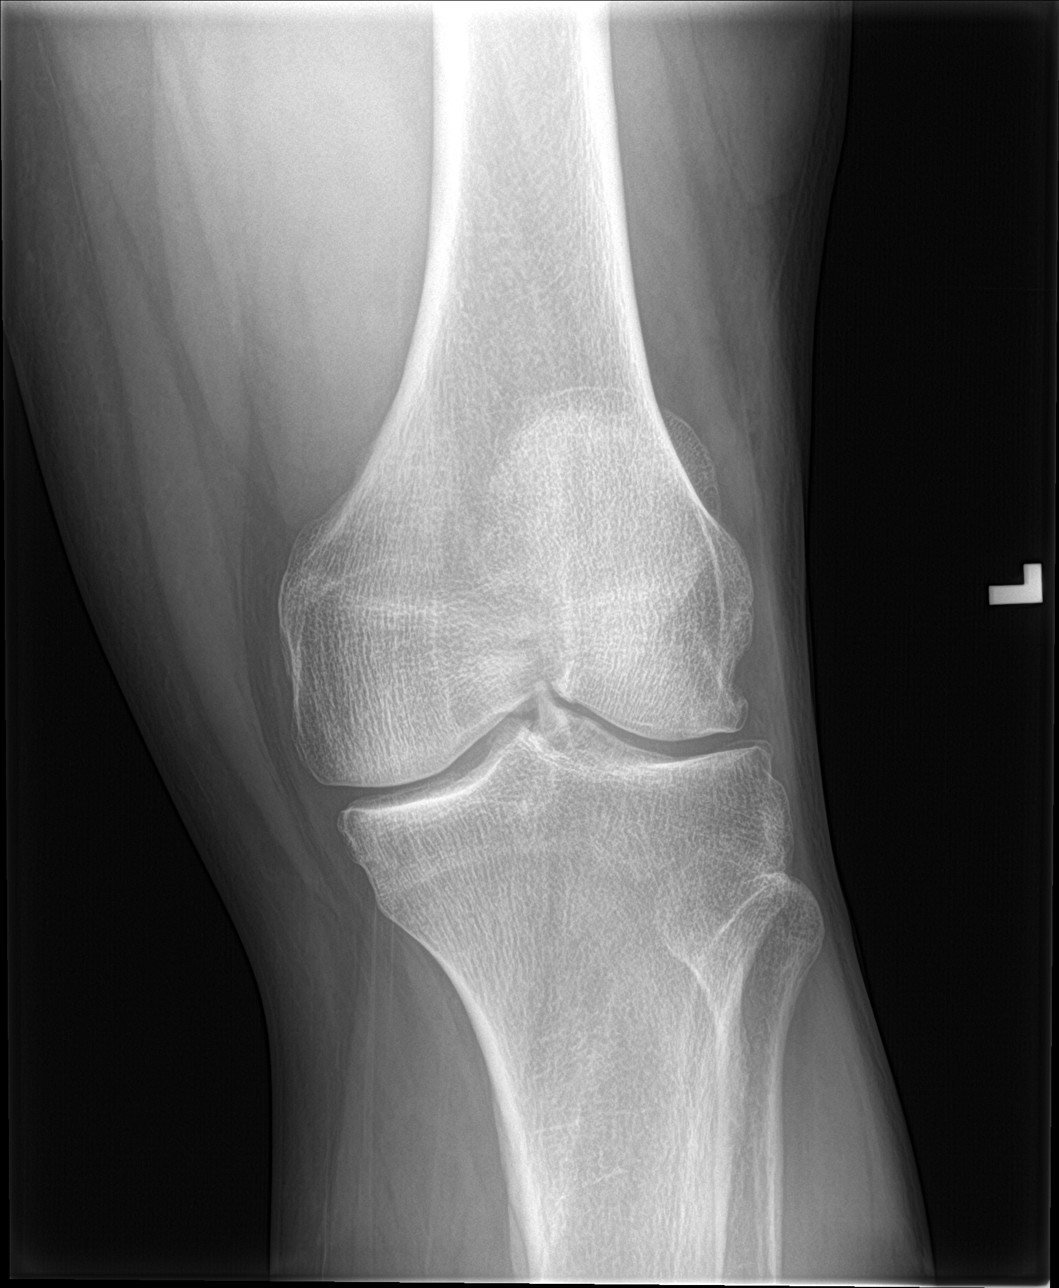

[knee lat]
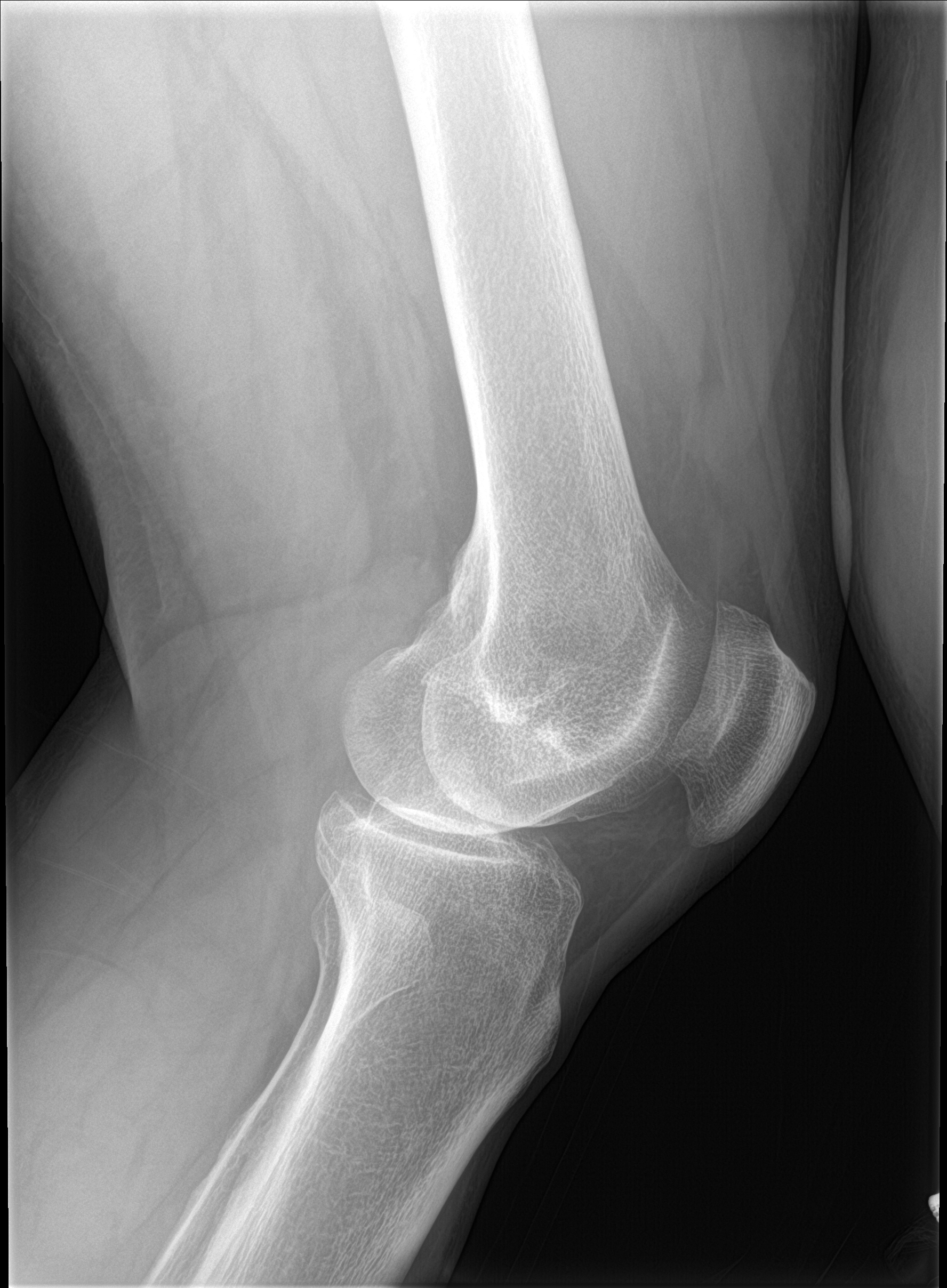

[knee obl (1 of 2)]
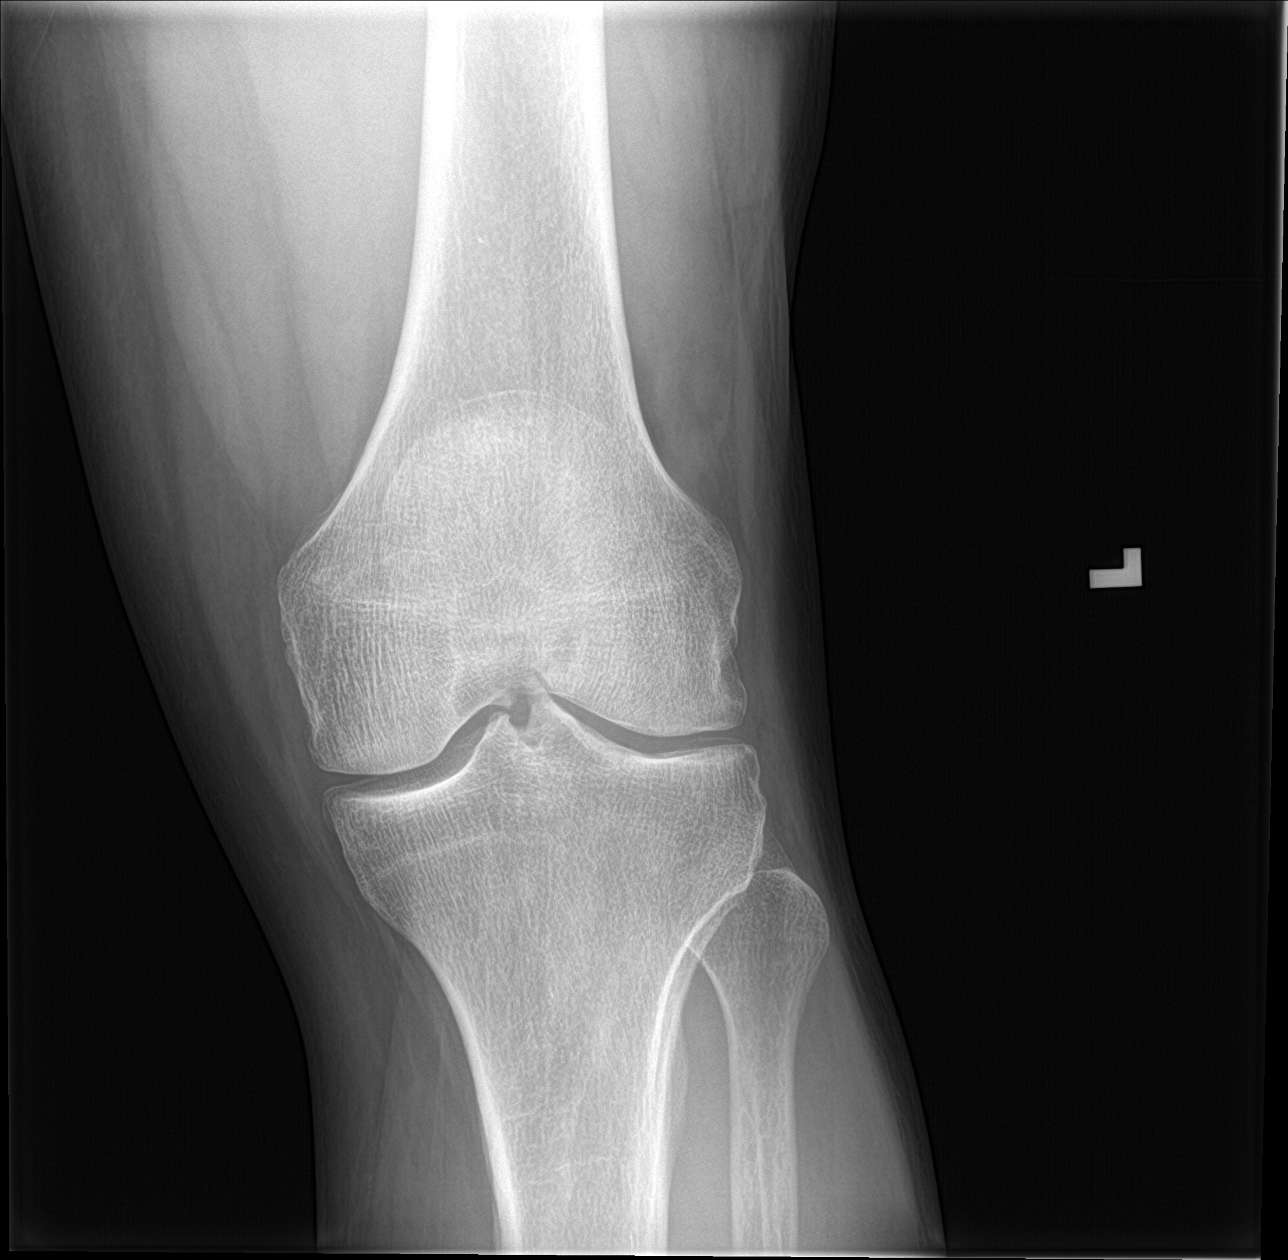

[knee obl (2 of 2)]
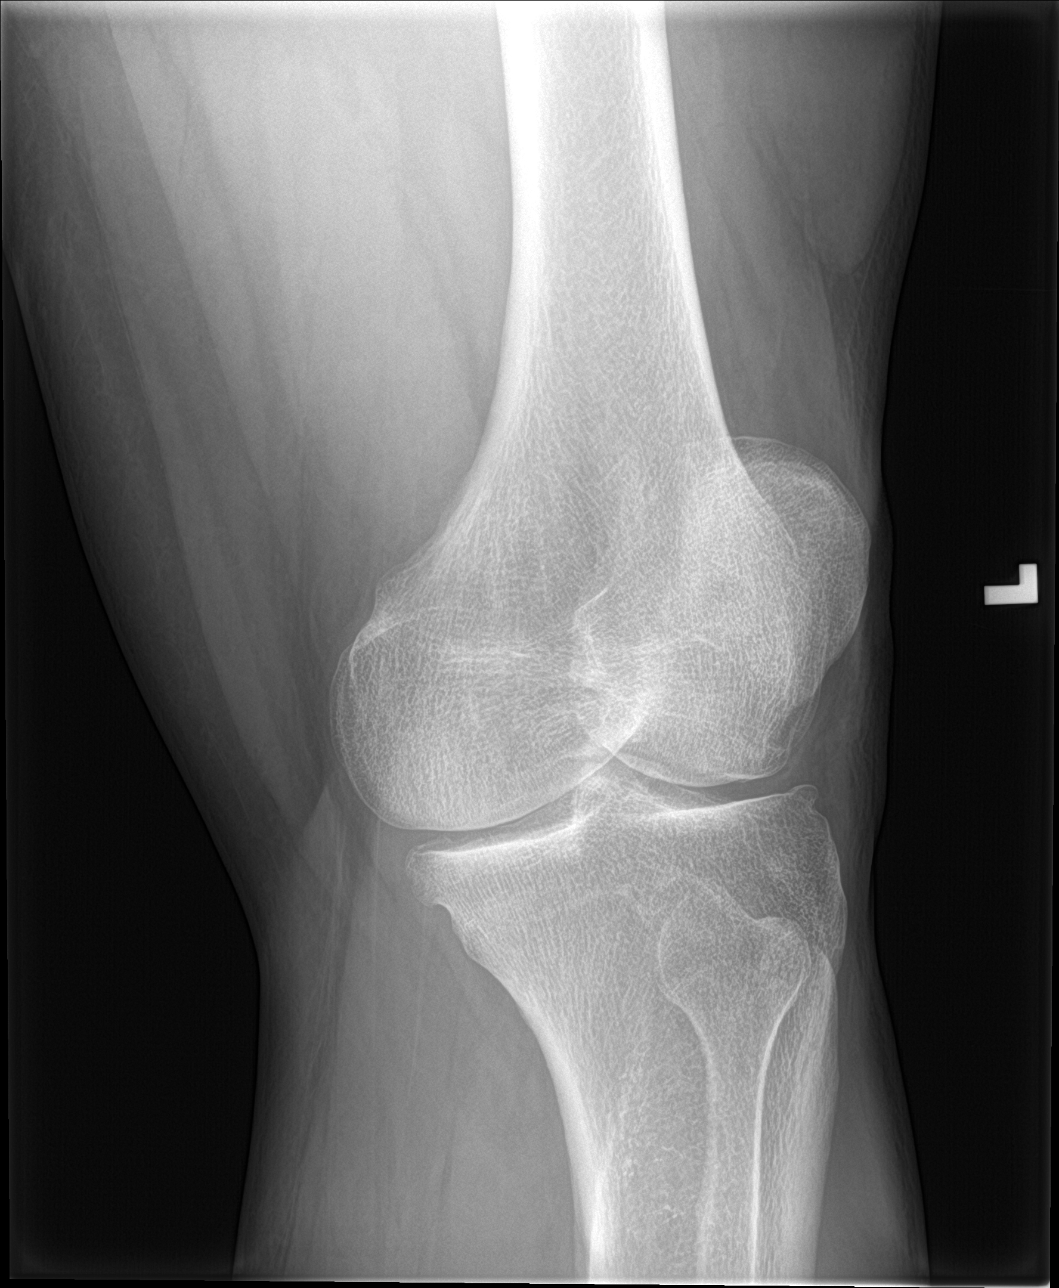

[4 of 4 positions shown; findings below may reference images not displayed]

FINDINGS: Age advanced tricompartmental degenerative changes with early joint
space narrowing and spurring. Marked peaking of the tibial spines.
No acute fracture or osteochondral lesion. No definite joint
effusion.
IMPRESSION: Age advanced tricompartmental degenerative changes.

No acute bony findings.

## 2021-10-02 ENCOUNTER — Other Ambulatory Visit: Payer: Self-pay

## 2021-10-02 ENCOUNTER — Other Ambulatory Visit (HOSPITAL_BASED_OUTPATIENT_CLINIC_OR_DEPARTMENT_OTHER): Payer: Self-pay

## 2021-10-02 ENCOUNTER — Encounter (HOSPITAL_BASED_OUTPATIENT_CLINIC_OR_DEPARTMENT_OTHER): Payer: Self-pay

## 2021-10-02 ENCOUNTER — Emergency Department (HOSPITAL_BASED_OUTPATIENT_CLINIC_OR_DEPARTMENT_OTHER)
Admission: EM | Admit: 2021-10-02 | Discharge: 2021-10-02 | Disposition: A | Discharge status: Home / Self Care | Payer: 59 | Attending: Emergency Medicine | Admitting: Emergency Medicine

## 2021-10-02 DIAGNOSIS — A084 Viral intestinal infection, unspecified: Secondary | ICD-10-CM | POA: Diagnosis not present

## 2021-10-02 DIAGNOSIS — R197 Diarrhea, unspecified: Secondary | ICD-10-CM | POA: Diagnosis present

## 2021-10-02 LAB — COMPREHENSIVE METABOLIC PANEL
ALT: 12 U/L (ref 0–44)
AST: 18 U/L (ref 15–41)
Albumin: 4 g/dL (ref 3.5–5.0)
Alkaline Phosphatase: 48 U/L (ref 38–126)
Anion gap: 9 (ref 5–15)
BUN: 14 mg/dL (ref 6–20)
CO2: 24 mmol/L (ref 22–32)
Calcium: 9.6 mg/dL (ref 8.9–10.3)
Chloride: 103 mmol/L (ref 98–111)
Creatinine, Ser: 0.94 mg/dL (ref 0.61–1.24)
GFR, Estimated: >60 mL/min (ref >60)
Glucose, Bld: 109 mg/dL — ABNORMAL HIGH (ref 70–99)
Potassium: 3.5 mmol/L (ref 3.5–5.1)
Sodium: 136 mmol/L (ref 135–145)
Total Bilirubin: 0.9 mg/dL (ref 0.3–1.2)
Total Protein: 8.6 g/dL — ABNORMAL HIGH (ref 6.5–8.1)

## 2021-10-02 LAB — CBC
HCT: 40.7 % (ref 39.0–52.0)
Hemoglobin: 13.6 g/dL (ref 13.0–17.0)
MCH: 30.8 pg (ref 26.0–34.0)
MCHC: 33.4 g/dL (ref 30.0–36.0)
MCV: 92.3 fL (ref 80.0–100.0)
Platelets: 317 10*3/uL (ref 150–400)
RBC: 4.41 MIL/uL (ref 4.22–5.81)
RDW: 12.1 % (ref 11.5–15.5)
WBC: 11.7 10*3/uL — ABNORMAL HIGH (ref 4.0–10.5)
nRBC: 0 % (ref 0.0–0.2)

## 2021-10-02 LAB — LIPASE, BLOOD: Lipase: <10 U/L — ABNORMAL LOW (ref 11–51)

## 2021-10-02 LAB — MAGNESIUM: Magnesium: 1.8 mg/dL (ref 1.7–2.4)

## 2021-10-02 MED ORDER — ACETAMINOPHEN 500 MG PO TABS
500.0000 mg | ORAL_TABLET | Freq: Four times a day (QID) | ORAL | 0 refills | Status: AC | PRN
  Filled 2021-10-02: qty 20, 5d supply, fill #0

## 2021-10-02 MED ORDER — LACTATED RINGERS IV BOLUS
1000.0000 mL | Freq: Once | INTRAVENOUS | Status: AC
  Administered 2021-10-02: 1000 mL via INTRAVENOUS

## 2021-10-02 MED ORDER — ONDANSETRON 4 MG PO TBDP
8.0000 mg | ORAL_TABLET | Freq: Once | ORAL | Status: DC
  Filled 2021-10-02: qty 2

## 2021-10-02 MED ORDER — LOPERAMIDE HCL 2 MG PO TABS
ORAL_TABLET | ORAL | 0 refills | Status: AC
  Filled 2021-10-02: qty 24, 8d supply, fill #0

## 2021-10-02 MED ORDER — ONDANSETRON 8 MG PO TBDP
8.0000 mg | ORAL_TABLET | Freq: Three times a day (TID) | ORAL | 0 refills | Status: AC | PRN
  Filled 2021-10-02: qty 10, 4d supply, fill #0

## 2021-10-02 MED ORDER — DICYCLOMINE HCL 20 MG PO TABS
20.0000 mg | ORAL_TABLET | Freq: Two times a day (BID) | ORAL | 0 refills | Status: AC
  Filled 2021-10-02: qty 10, 5d supply, fill #0

## 2021-10-02 NOTE — ED Provider Notes (Signed)
[facility] EMERGENCY DEPT Provider Note   CSN: [id number] Arrival date & time: 10/02/21  0940     History  Chief Complaint  Patient presents with   Abdominal Pain    Jesus Bradford is a 56 y.o. male.  HPI     Jesus Bradford is a 56 y/o male with a past medical history of recent Left ACL repair on 09/26/21 who presents to the ED with complaints of 5 days of diarrhea with associated two days of abdominal cramping, nausea, vomiting and lightheadedness. Patient reports that him and his wife recently traveled to [place] over the weekend and recalls eating a lot of food. On Saturday, 09/28/21, he started experiencing diarrhea. He states that originally the diarrhea was "normal" but it now has more of a liquid composition and any time he eats, he has to immediately go to the bathroom. Patient denies any blood or dark/tarry stools. Patient attests that his abdominal cramping onset three days later and it feels like someone is twisting his intestines. Furthermore, patient started experiencing some nausea/vomiting which has prevented him from eating out of fear of causing another episode. Patient does attest that two days ago, he was vomiting so much that he felt clammy and like he was going to pass out. Patient reports that yesterday, he had a similar episode of nausea, but did not feel lightheaded. Patient reports that he is vomiting clear contents and denies any bilious or blood. Patient has taken Imodium 3 times within a 24-hour period without any relief. Patient denies fevers, chills, diaphoresis, chest pain, chest tightness, shortness of breath, hematuria, dysuria, or urinary difficulty. Patient denies any known sick contacts or recent illnesses. Patient denies any history of abdominal procedures.  Assessment: Food poisoning, viral gastroenteritis   Home Medications Prior to Admission medications   Medication Sig Start Date End Date Taking? Authorizing Provider  loperamide (IMODIUM)  2 MG capsule Take 4 mg nightly as needed for nighttime diarrhea Take additional 2 mg 1 hour after if the diarrhea persists 10/02/21  Yes Keyston Ardolino, MD  ondansetron (ZOFRAN-ODT) 8 MG disintegrating tablet Take 1 tablet (8 mg total) by mouth every 8 (eight) hours as needed for nausea. 10/02/21  Yes Dinara Lupu, MD  eletriptan (RELPAX) 20 MG tablet Take 20 mg by mouth as needed for migraine or headache. May repeat in 2 hours if headache persists or recurs.    [provider]  HYDROcodone-acetaminophen (NORCO/VICODIN) 5-325 MG tablet Take 1-2 tablets by mouth every 6 (six) hours as needed for severe pain (post op pain). 09/27/19   Elodia Florence, PA-C  Nebivolol HCl (BYSTOLIC PO) Take by mouth.    [provider]  rosuvastatin (CRESTOR) 20 MG tablet Take 20 mg by mouth once a week. 11/26/18   [provider]      Allergies    Patient has no known allergies.    Review of Systems   Review of Systems  All other systems reviewed and are negative.   Physical Exam Updated Vital Signs BP (!) 146/82   Pulse 79   Temp 98 F (36.7 C)   Resp 18   Ht 6' (1.829 m)   Wt 133.8 kg   SpO2 98%   BMI 40.01 kg/m  Physical Exam Vitals and nursing note reviewed.  Constitutional:      Appearance: He is well-developed.  HENT:     Head: Atraumatic.  Cardiovascular:     Rate and Rhythm: Normal rate.  Pulmonary:     Effort:  Pulmonary effort is normal.  Abdominal:     Tenderness: There is no abdominal tenderness.  Musculoskeletal:     Cervical back: Neck supple.  Skin:    General: Skin is warm.  Neurological:     Mental Status: He is alert and oriented to person, place, and time.     ED Results / Procedures / Treatments   Labs (all labs ordered are listed, but only abnormal results are displayed) Labs Reviewed  LIPASE, BLOOD - Abnormal; Notable for the following components:      Result Value   Lipase <10 (*)    All other components within normal limits   COMPREHENSIVE METABOLIC PANEL - Abnormal; Notable for the following components:   Glucose, Bld 109 (*)    Total Protein 8.6 (*)    All other components within normal limits  CBC - Abnormal; Notable for the following components:   WBC 11.7 (*)    All other components within normal limits  MAGNESIUM  URINALYSIS, ROUTINE W REFLEX MICROSCOPIC    EKG EKG Interpretation  Date/Time:  Wednesday October 02 2021 10:21:52 EDT Ventricular Rate:  103 PR Interval:  124 QRS Duration: 92 QT Interval:  364 QTC Calculation: 476 R Axis:   -8 Text Interpretation: Sinus tachycardia Otherwise normal ECG When compared with ECG of 25-Nov-2018 10:08, PREVIOUS ECG IS PRESENT No acute changes No significant change since last tracing Confirmed by Derwood Kaplan [phone]) on 10/02/2021 12:09:31 PM  Radiology No results found.  Procedures Procedures    Medications Ordered in ED Medications  ondansetron (ZOFRAN-ODT) disintegrating tablet 8 mg (0 mg Oral Hold 10/02/21 1220)  lactated ringers bolus 1,000 mL (0 mLs Intravenous Stopped 10/02/21 1319)    ED Course/ Medical Decision Making/ A&P                           Medical Decision Making Amount and/or Complexity of Data Reviewed Labs: ordered.  Risk Prescription drug management.  This patient presents to the ED with chief complaint(s) of diarrhea with intermittent cramping abdominal pain and nausea with pertinent past medical history of hypertension, OSA and a recent visit to [place].  He did have fried shrimp.The complaint involves an extensive differential diagnosis and also carries with it a high risk of complications and morbidity.    It appears that patient had some shrimp on Friday, the next day he started having GI symptoms.  3 days later his symptoms actually worsened.  No dysentery, no fevers.  The differential diagnosis includes colitis secondary to viral process, bacterial colitis, food toxin mediated process.  We suspect that the  process is most likely viral as the symptoms have progressed further 3 days later, contrary to foodborne illness.  Differential diagnosis also includes electrolyte abnormality, dehydration, AKI  The initial plan is to CBC, metabolic profile and reassess the patient.  We will give patient some IV fluid.   Additional history obtained: Additional history obtained from spouse  Independent labs interpretation:  The following labs were independently interpreted: CBC, metabolic profile and lipase are reassuring.  Magnesium is also normal.  No renal failure.  No profound dehydration based on labs.   Treatment and Reassessment: Patient has received his IV fluids.  He has passed oral challenge.  Stable for discharge with return precautions.    Final Clinical Impression(s) / ED Diagnoses Final diagnoses:  Viral diarrhea    Rx / DC Orders ED Discharge Orders  Ordered    ondansetron (ZOFRAN-ODT) 8 MG disintegrating tablet  Every 8 hours PRN        10/02/21 1351    loperamide (IMODIUM) 2 MG capsule        10/02/21 1351              Derwood Kaplan, MD 10/02/21 1353

## 2021-10-02 NOTE — ED Triage Notes (Signed)
Pt states is had V/D/N. Clammy sweating, x2 days. Pain 9/10. Stomach cramps. Mid stomach pain. Vomited 1 last night and the night before.

## 2021-10-02 NOTE — Discharge Instructions (Addendum)
You are seen in the ER for diarrhea.  Your blood work is overall reassuring.  We suspect that you likely have viral diarrhea that should run its course in a total of 3 to 7 days after onset.  Return to the emergency room if you start having high-grade fevers, severe abdominal pain, severe nausea and vomiting, bloody stools.  See your primary care doctor if the diarrhea does not get better in 2 weeks.

## 2022-08-19 ENCOUNTER — Encounter (HOSPITAL_BASED_OUTPATIENT_CLINIC_OR_DEPARTMENT_OTHER): Payer: Self-pay | Admitting: Emergency Medicine

## 2022-08-19 ENCOUNTER — Other Ambulatory Visit: Payer: Self-pay

## 2022-08-19 ENCOUNTER — Emergency Department (HOSPITAL_BASED_OUTPATIENT_CLINIC_OR_DEPARTMENT_OTHER)
Admission: EM | Admit: 2022-08-19 | Discharge: 2022-08-19 | Disposition: A | Discharge status: Home / Self Care | Payer: 59 | Source: Home / Self Care | Attending: Emergency Medicine | Admitting: Emergency Medicine

## 2022-08-19 DIAGNOSIS — M545 Low back pain, unspecified: Secondary | ICD-10-CM

## 2022-08-19 MED ORDER — CYCLOBENZAPRINE HCL 10 MG PO TABS: 10.0000 mg | ORAL_TABLET | Freq: Three times a day (TID) | ORAL | 0 refills | Status: AC

## 2022-08-19 MED ORDER — NAPROXEN 500 MG PO TABS: 500.0000 mg | ORAL_TABLET | Freq: Two times a day (BID) | ORAL | 0 refills | Status: AC

## 2022-08-19 NOTE — ED Notes (Signed)
Pt provided discharge instructions. Reviewed medications and prescriptions with pt. Pt verbalized understanding and denied any questions. NAD noted at discharge, pt ambulatory.

## 2022-08-19 NOTE — ED Triage Notes (Signed)
Pt arrived POV from home, caox4, ambulatory, NAD. Pt c/o midline lower back pain radiating to both sides that started Sat while at rest. Pt denies recent injury/fall/trauma, but does do lifting and moving at workplace. Unable to correlate pain with any recent event. Pt states no pain while still, but gets pain upon position changes and exertion. Denies numbness or tingling in extremities but states his "legs feel weak" when he tries to stand from sitting position.

## 2022-08-19 NOTE — Discharge Instructions (Signed)
Recommend the Naprosyn for 7 days and the Flexeril.  Rest is much as possible.  Make an appointment to follow-up with orthopedics make an appointment to follow-up with your primary care doctor.  Return for any new or worse symptoms.

## 2022-08-19 NOTE — ED Provider Notes (Signed)
[place] EMERGENCY DEPARTMENT AT [facility] Provider Note   CSN: [id number] Arrival date & time: 08/19/22  0741     History  Chief Complaint  Patient presents with   Back Pain    Jesus Bradford is a 57 y.o. male.  Patient on Saturday started with low back pain, radiates into the upper part of the buttocks.  No fall or injury.  Kind of tender to palpation right at the distal lumbar spine.  No weakness or numbness to his feet.  No incontinence.  Patient feels comfortable standing or sitting but it is the transition that causes the discomfort.       Home Medications Prior to Admission medications   Medication Sig Start Date End Date Taking? Authorizing Provider  cyclobenzaprine (FLEXERIL) 10 MG tablet Take 1 tablet (10 mg total) by mouth 3 (three) times daily. 08/19/22  Yes Vanetta Mulders, MD  naproxen (NAPROSYN) 500 MG tablet Take 1 tablet (500 mg total) by mouth 2 (two) times daily. 08/19/22  Yes Vanetta Mulders, MD  acetaminophen (TYLENOL) 500 MG tablet Take 1 tablet (500 mg total) by mouth every 6 (six) hours as needed. 10/02/21   Derwood Kaplan, MD  dicyclomine (BENTYL) 20 MG tablet Take 1 tablet (20 mg total) by mouth 2 (two) times daily. 10/02/21   Derwood Kaplan, MD  eletriptan (RELPAX) 20 MG tablet Take 20 mg by mouth as needed for migraine or headache. May repeat in 2 hours if headache persists or recurs.    [provider]  HYDROcodone-acetaminophen (NORCO/VICODIN) 5-325 MG tablet Take 1-2 tablets by mouth every 6 (six) hours as needed for severe pain (post op pain). 09/27/19   Elodia Florence, PA-C  loperamide (IMODIUM A-D) 2 MG tablet Take 2 tablets (4 mg) by mouth nightly as needed for nighttime diarrhea, may take 1 additional capsule (2 mg) by mouth 1 hour after if the diarrhea persists. 10/02/21   Derwood Kaplan, MD  Nebivolol HCl (BYSTOLIC PO) Take by mouth.    [provider]  ondansetron (ZOFRAN-ODT) 8 MG disintegrating tablet Take 1 tablet (8  mg total) by mouth every 8 (eight) hours as needed for nausea. 10/02/21   Derwood Kaplan, MD  rosuvastatin (CRESTOR) 20 MG tablet Take 20 mg by mouth once a week. 11/26/18   [provider]      Allergies    Patient has no known allergies.    Review of Systems   Review of Systems  Constitutional:  Negative for chills and fever.  HENT:  Negative for ear pain and sore throat.   Eyes:  Negative for pain and visual disturbance.  Respiratory:  Negative for cough and shortness of breath.   Cardiovascular:  Negative for chest pain and palpitations.  Gastrointestinal:  Negative for abdominal pain and vomiting.  Genitourinary:  Negative for dysuria and hematuria.  Musculoskeletal:  Positive for back pain. Negative for arthralgias.  Skin:  Negative for color change and rash.  Neurological:  Negative for seizures, syncope, weakness and numbness.  All other systems reviewed and are negative.   Physical Exam Updated Vital Signs BP (!) 144/85 (BP Location: Right Arm)   Pulse 73   Temp 98 F (36.7 C) (Oral)   Resp 20   SpO2 97%  Physical Exam Vitals and nursing note reviewed.  Constitutional:      General: He is not in acute distress.    Appearance: Normal appearance. He is well-developed. He is not ill-appearing.  HENT:     Head: Normocephalic  and atraumatic.  Eyes:     Extraocular Movements: Extraocular movements intact.     Conjunctiva/sclera: Conjunctivae normal.  Cardiovascular:     Rate and Rhythm: Normal rate and regular rhythm.     Heart sounds: No murmur heard. Pulmonary:     Effort: Pulmonary effort is normal. No respiratory distress.     Breath sounds: Normal breath sounds.  Abdominal:     Palpations: Abdomen is soft.     Tenderness: There is no abdominal tenderness.  Musculoskeletal:        General: Tenderness present. No swelling.     Cervical back: Neck supple.     Comments: Tenderness to palpation lower lumbar spine.  Skin:    General: Skin is warm and  dry.     Capillary Refill: Capillary refill takes less than 2 seconds.  Neurological:     General: No focal deficit present.     Mental Status: He is alert and oriented to person, place, and time.  Psychiatric:        Mood and Affect: Mood normal.     ED Results / Procedures / Treatments   Labs (all labs ordered are listed, but only abnormal results are displayed) Labs Reviewed - No data to display  EKG None  Radiology No results found.  Procedures Procedures    Medications Ordered in ED Medications - No data to display  ED Course/ Medical Decision Making/ A&P                             Medical Decision Making Risk Prescription drug management.  Patient without any evidence of any sciatica.  Will treat symptomatically with Naprosyn.  Patient has no contraindication to Naprosyn according to him.  And Flexeril.  Work note provided is needed.  Recommend following up with orthopedics and follow-up with your primary care doctor.  Final Clinical Impression(s) / ED Diagnoses Final diagnoses:  Acute midline low back pain without sciatica    Rx / DC Orders ED Discharge Orders          Ordered    naproxen (NAPROSYN) 500 MG tablet  2 times daily        08/19/22 0901    cyclobenzaprine (FLEXERIL) 10 MG tablet  3 times daily        08/19/22 0901              Vanetta Mulders, MD 08/19/22 [phone]

## 2022-09-17 ENCOUNTER — Other Ambulatory Visit: Payer: Self-pay

## 2022-09-18 ENCOUNTER — Other Ambulatory Visit: Payer: Self-pay

## 2022-09-22 ENCOUNTER — Other Ambulatory Visit: Payer: Self-pay

## 2022-09-25 ENCOUNTER — Other Ambulatory Visit: Payer: Self-pay

## 2022-09-26 ENCOUNTER — Other Ambulatory Visit: Payer: Self-pay

## 2023-01-17 ENCOUNTER — Telehealth: Payer: 59 | Admitting: Physician Assistant

## 2023-01-17 DIAGNOSIS — J069 Acute upper respiratory infection, unspecified: Secondary | ICD-10-CM

## 2023-01-17 DIAGNOSIS — J208 Acute bronchitis due to other specified organisms: Secondary | ICD-10-CM | POA: Diagnosis not present

## 2023-01-17 MED ORDER — BENZONATATE 100 MG PO CAPS: 100.0000 mg | ORAL_CAPSULE | Freq: Two times a day (BID) | ORAL | 0 refills | Status: AC | PRN

## 2023-01-17 MED ORDER — METHYLPREDNISOLONE 4 MG PO TBPK: ORAL_TABLET | ORAL | 0 refills | Status: AC

## 2023-01-17 MED ORDER — CETIRIZINE HCL 10 MG PO TABS: 10.0000 mg | ORAL_TABLET | Freq: Every day | ORAL | 0 refills | Status: AC

## 2023-01-17 MED ORDER — DM-GUAIFENESIN ER 30-600 MG PO TB12: 1.0000 | ORAL_TABLET | Freq: Two times a day (BID) | ORAL | 0 refills | Status: AC

## 2023-01-17 NOTE — Progress Notes (Signed)
Virtual Visit Consent   Jesus Bradford, you are scheduled for a virtual visit with a [facility] provider today. Just as with appointments in the office, your consent must be obtained to participate. Your consent will be active for this visit and any virtual visit you may have with one of our providers in the next 365 days. If you have a MyChart account, a copy of this consent can be sent to you electronically.  As this is a virtual visit, video technology does not allow for your provider to perform a traditional examination. This may limit your provider's ability to fully assess your condition. If your provider identifies any concerns that need to be evaluated in person or the need to arrange testing (such as labs, EKG, etc.), we will make arrangements to do so. Although advances in technology are sophisticated, we cannot ensure that it will always work on either your end or our end. If the connection with a video visit is poor, the visit may have to be switched to a telephone visit. With either a video or telephone visit, we are not always able to ensure that we have a secure connection.  By engaging in this virtual visit, you consent to the provision of healthcare and authorize for your insurance to be billed (if applicable) for the services provided during this visit. Depending on your insurance coverage, you may receive a charge related to this service.  I need to obtain your verbal consent now. Are you willing to proceed with your visit today? Jesus Bradford has provided verbal consent on 01/17/2023 for a virtual visit (video or telephone). Laure Kidney, [place]  Date: 01/17/2023 3:19 PM  Virtual Visit via Video Note   I, Laure Kidney, connected with  Jesus Bradford  ([id number], 02-Apr-1965) on 01/17/23 at  3:15 PM EST by a video-enabled telemedicine application and verified that I am speaking with the correct person using two identifiers.  Location: Patient: Virtual Visit Location Patient:  Home Provider: Virtual Visit Location Provider: Home Office   I discussed the limitations of evaluation and management by telemedicine and the availability of in person appointments. The patient expressed understanding and agreed to proceed.    History of Present Illness: Jesus Bradford is a 57 y.o. who identifies as a male who was assigned male at birth, and is being seen today for uri with cough.  HPI: URI  This is a new problem. The current episode started in the past 7 days. The problem has been unchanged. Associated symptoms include congestion and coughing.    Problems:  Patient Active Problem List   Diagnosis Date Noted   Morbid obesity (HCC) 06/20/2011   OBSTRUCTIVE SLEEP APNEA 05/12/2008    Allergies: No Known Allergies Medications:  Current Outpatient Medications:    acetaminophen (TYLENOL) 500 MG tablet, Take 1 tablet (500 mg total) by mouth every 6 (six) hours as needed., Disp: 20 tablet, Rfl: 0   cyclobenzaprine (FLEXERIL) 10 MG tablet, Take 1 tablet (10 mg total) by mouth 3 (three) times daily., Disp: 20 tablet, Rfl: 0   dicyclomine (BENTYL) 20 MG tablet, Take 1 tablet (20 mg total) by mouth 2 (two) times daily., Disp: 10 tablet, Rfl: 0   eletriptan (RELPAX) 20 MG tablet, Take 20 mg by mouth as needed for migraine or headache. May repeat in 2 hours if headache persists or recurs., Disp: , Rfl:    HYDROcodone-acetaminophen (NORCO/VICODIN) 5-325 MG tablet, Take 1-2 tablets by mouth every 6 (six) hours as needed for severe pain (  post op pain)., Disp: 20 tablet, Rfl: 0   loperamide (IMODIUM A-D) 2 MG tablet, Take 2 tablets (4 mg) by mouth nightly as needed for nighttime diarrhea, may take 1 additional capsule (2 mg) by mouth 1 hour after if the diarrhea persists., Disp: 24 tablet, Rfl: 0   naproxen (NAPROSYN) 500 MG tablet, Take 1 tablet (500 mg total) by mouth 2 (two) times daily., Disp: 14 tablet, Rfl: 0   Nebivolol HCl (BYSTOLIC PO), Take by mouth., Disp: , Rfl:    ondansetron  (ZOFRAN-ODT) 8 MG disintegrating tablet, Take 1 tablet (8 mg total) by mouth every 8 (eight) hours as needed for nausea., Disp: 10 tablet, Rfl: 0   rosuvastatin (CRESTOR) 20 MG tablet, Take 20 mg by mouth once a week., Disp: , Rfl:   Observations/Objective: Patient is well-developed, well-nourished in no acute distress.  Resting comfortably  at home.  Head is normocephalic, atraumatic.  No labored breathing. Speech is clear and coherent with logical content.  Patient is alert and oriented at baseline.    Assessment and Plan: 1. Viral URI with cough (Primary)  2. Acute viral bronchitis patient presents with symptoms suspicious for likely viral upper respiratory infection. Differential includes bacterial pneumonia, sinusitis, allergic rhinitis. Do not suspect underlying cardiopulmonary process. I considered, but think unlikely, dangerous causes of this patient's symptoms to include ACS, CHF or COPD exacerbations, pneumonia, pneumothorax. Patient is nontoxic appearing and not in need of emergent medical intervention.  Plan: reassurance, reassessment, over the counter medications, discharge with PCP followup Follow Up Instructions: I discussed the assessment and treatment plan with the patient. The patient was provided an opportunity to ask questions and all were answered. The patient agreed with the plan and demonstrated an understanding of the instructions.  A copy of instructions were sent to the patient via MyChart unless otherwise noted below.    The patient was advised to call back or seek an in-person evaluation if the symptoms worsen or if the condition fails to improve as anticipated.    Laure Kidney, PA-C

## 2023-01-17 NOTE — Patient Instructions (Signed)
Jesus Bradford, thank you for joining Laure Kidney, PA-C for today's virtual visit.  While this provider is not your primary care provider (PCP), if your PCP is located in our provider database this encounter information will be shared with them immediately following your visit.   A [facility] MyChart account gives you access to today's visit and all your visits, tests, and labs performed at [facility] " click here if you don't have a [facility] MyChart account or go to [email or web address]  Consent: (Patient) Jesus Bradford provided verbal consent for this virtual visit at the beginning of the encounter.  Current Medications:  Current Outpatient Medications:    benzonatate (TESSALON) 100 MG capsule, Take 1 capsule (100 mg total) by mouth 2 (two) times daily as needed for cough., Disp: 20 capsule, Rfl: 0   cetirizine (ZYRTEC ALLERGY) 10 MG tablet, Take 1 tablet (10 mg total) by mouth daily for 14 days., Disp: 14 tablet, Rfl: 0   dextromethorphan-guaiFENesin (MUCINEX DM) 30-600 MG 12hr tablet, Take 1 tablet by mouth 2 (two) times daily for 7 days., Disp: 14 tablet, Rfl: 0   methylPREDNISolone (MEDROL DOSEPAK) 4 MG TBPK tablet, Take as directed on package., Disp: 1 each, Rfl: 0   acetaminophen (TYLENOL) 500 MG tablet, Take 1 tablet (500 mg total) by mouth every 6 (six) hours as needed., Disp: 20 tablet, Rfl: 0   cyclobenzaprine (FLEXERIL) 10 MG tablet, Take 1 tablet (10 mg total) by mouth 3 (three) times daily., Disp: 20 tablet, Rfl: 0   dicyclomine (BENTYL) 20 MG tablet, Take 1 tablet (20 mg total) by mouth 2 (two) times daily., Disp: 10 tablet, Rfl: 0   eletriptan (RELPAX) 20 MG tablet, Take 20 mg by mouth as needed for migraine or headache. May repeat in 2 hours if headache persists or recurs., Disp: , Rfl:    HYDROcodone-acetaminophen (NORCO/VICODIN) 5-325 MG tablet, Take 1-2 tablets by mouth every 6 (six) hours as needed for severe pain (post op pain)., Disp: 20 tablet, Rfl:  0   loperamide (IMODIUM A-D) 2 MG tablet, Take 2 tablets (4 mg) by mouth nightly as needed for nighttime diarrhea, may take 1 additional capsule (2 mg) by mouth 1 hour after if the diarrhea persists., Disp: 24 tablet, Rfl: 0   naproxen (NAPROSYN) 500 MG tablet, Take 1 tablet (500 mg total) by mouth 2 (two) times daily., Disp: 14 tablet, Rfl: 0   Nebivolol HCl (BYSTOLIC PO), Take by mouth., Disp: , Rfl:    ondansetron (ZOFRAN-ODT) 8 MG disintegrating tablet, Take 1 tablet (8 mg total) by mouth every 8 (eight) hours as needed for nausea., Disp: 10 tablet, Rfl: 0   rosuvastatin (CRESTOR) 20 MG tablet, Take 20 mg by mouth once a week., Disp: , Rfl:    Medications ordered in this encounter:  Meds ordered this encounter  Medications   methylPREDNISolone (MEDROL DOSEPAK) 4 MG TBPK tablet    Sig: Take as directed on package.    Dispense:  1 each    Refill:  0    Supervising Provider:   Merrilee Jansky [[id number]]   benzonatate (TESSALON) 100 MG capsule    Sig: Take 1 capsule (100 mg total) by mouth 2 (two) times daily as needed for cough.    Dispense:  20 capsule    Refill:  0    Supervising Provider:   Merrilee Jansky [[id number]]   cetirizine (ZYRTEC ALLERGY) 10 MG tablet    Sig: Take 1 tablet (10 mg total) by mouth daily  for 14 days.    Dispense:  14 tablet    Refill:  0    Supervising Provider:   Merrilee Jansky [[id number]]   dextromethorphan-guaiFENesin (MUCINEX DM) 30-600 MG 12hr tablet    Sig: Take 1 tablet by mouth 2 (two) times daily for 7 days.    Dispense:  14 tablet    Refill:  0    Supervising Provider:   Merrilee Jansky [[id number]]     *If you need refills on other medications prior to your next appointment, please contact your pharmacy*  Follow-Up: Call back or seek an in-person evaluation if the symptoms worsen or if the condition fails to improve as anticipated.  [facility] [phone]  Other Instructions Follow up with your PCP in 2-3 days if your  symptoms are not improving.    If you have been instructed to have an in-person evaluation today at a local Urgent Care facility, please use the link below. It will take you to a list of all of our available [facility], including address, phone number and hours of operation. Please do not delay care.  [facility]  If you or a family member do not have a primary care provider, use the link below to schedule a visit and establish care. When you choose a [facility] primary care physician or advanced practice provider, you gain a long-term partner in health. Find a Primary Care Provider  Learn more about [facility] in-office and virtual care options: [facility] - Get Care Now

## 2023-05-28 ENCOUNTER — Other Ambulatory Visit (HOSPITAL_COMMUNITY): Payer: Self-pay

## 2023-12-10 ENCOUNTER — Other Ambulatory Visit: Payer: Self-pay
